# Patient Record
Sex: Female | Born: 2000 | Race: White | Hispanic: No | Marital: Single | Smoking: Never smoker
Health system: Southern US, Community
[De-identification: ages and names within clinical notes are randomized; demographics above are authoritative.]

## PROBLEM LIST (undated history)

## (undated) DIAGNOSIS — F32A Depression, unspecified: Secondary | ICD-10-CM

## (undated) DIAGNOSIS — F419 Anxiety disorder, unspecified: Secondary | ICD-10-CM

## (undated) DIAGNOSIS — K219 Gastro-esophageal reflux disease without esophagitis: Secondary | ICD-10-CM

## (undated) DIAGNOSIS — F329 Major depressive disorder, single episode, unspecified: Secondary | ICD-10-CM

## (undated) DIAGNOSIS — J309 Allergic rhinitis, unspecified: Secondary | ICD-10-CM

## (undated) DIAGNOSIS — G43909 Migraine, unspecified, not intractable, without status migrainosus: Secondary | ICD-10-CM

## (undated) DIAGNOSIS — J45909 Unspecified asthma, uncomplicated: Secondary | ICD-10-CM

## (undated) HISTORY — PX: WISDOM TOOTH EXTRACTION: SHX21

## (undated) HISTORY — DX: Depression, unspecified: F32.A

## (undated) HISTORY — DX: Anxiety disorder, unspecified: F41.9

## (undated) HISTORY — PX: BUNIONECTOMY: SHX129

## (undated) HISTORY — DX: Gastro-esophageal reflux disease without esophagitis: K21.9

## (undated) HISTORY — DX: Unspecified asthma, uncomplicated: J45.909

## (undated) HISTORY — DX: Allergic rhinitis, unspecified: J30.9

## (undated) HISTORY — DX: Migraine, unspecified, not intractable, without status migrainosus: G43.909

---
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
## 2019-01-02 ENCOUNTER — Encounter: Payer: Federal, State, Local not specified - PPO | Admitting: Podiatry

## 2019-01-07 DIAGNOSIS — F411 Generalized anxiety disorder: Secondary | ICD-10-CM | POA: Diagnosis not present

## 2019-01-15 DIAGNOSIS — J189 Pneumonia, unspecified organism: Secondary | ICD-10-CM | POA: Diagnosis not present

## 2019-01-15 DIAGNOSIS — U071 COVID-19: Secondary | ICD-10-CM | POA: Diagnosis not present

## 2019-01-15 DIAGNOSIS — F332 Major depressive disorder, recurrent severe without psychotic features: Secondary | ICD-10-CM | POA: Diagnosis not present

## 2019-01-15 DIAGNOSIS — F418 Other specified anxiety disorders: Secondary | ICD-10-CM | POA: Diagnosis not present

## 2019-01-16 ENCOUNTER — Encounter: Payer: Federal, State, Local not specified - PPO | Admitting: Podiatry

## 2019-01-27 DIAGNOSIS — F411 Generalized anxiety disorder: Secondary | ICD-10-CM | POA: Diagnosis not present

## 2019-01-30 ENCOUNTER — Encounter: Payer: Federal, State, Local not specified - PPO | Admitting: Podiatry

## 2019-02-03 DIAGNOSIS — F411 Generalized anxiety disorder: Secondary | ICD-10-CM | POA: Diagnosis not present

## 2019-02-10 DIAGNOSIS — F411 Generalized anxiety disorder: Secondary | ICD-10-CM | POA: Diagnosis not present

## 2019-02-12 DIAGNOSIS — F332 Major depressive disorder, recurrent severe without psychotic features: Secondary | ICD-10-CM | POA: Diagnosis not present

## 2019-02-12 DIAGNOSIS — F418 Other specified anxiety disorders: Secondary | ICD-10-CM | POA: Diagnosis not present

## 2019-02-17 DIAGNOSIS — F411 Generalized anxiety disorder: Secondary | ICD-10-CM | POA: Diagnosis not present

## 2019-02-28 DIAGNOSIS — F509 Eating disorder, unspecified: Secondary | ICD-10-CM | POA: Diagnosis not present

## 2019-02-28 DIAGNOSIS — F418 Other specified anxiety disorders: Secondary | ICD-10-CM | POA: Diagnosis not present

## 2019-02-28 DIAGNOSIS — F332 Major depressive disorder, recurrent severe without psychotic features: Secondary | ICD-10-CM | POA: Diagnosis not present

## 2019-04-09 DIAGNOSIS — R300 Dysuria: Secondary | ICD-10-CM | POA: Diagnosis not present

## 2019-04-09 DIAGNOSIS — N390 Urinary tract infection, site not specified: Secondary | ICD-10-CM | POA: Diagnosis not present

## 2019-04-15 DIAGNOSIS — Z01419 Encounter for gynecological examination (general) (routine) without abnormal findings: Secondary | ICD-10-CM | POA: Diagnosis not present

## 2019-04-15 DIAGNOSIS — F419 Anxiety disorder, unspecified: Secondary | ICD-10-CM | POA: Insufficient documentation

## 2019-04-15 DIAGNOSIS — Z113 Encounter for screening for infections with a predominantly sexual mode of transmission: Secondary | ICD-10-CM | POA: Diagnosis not present

## 2019-04-23 DIAGNOSIS — Z79891 Long term (current) use of opiate analgesic: Secondary | ICD-10-CM | POA: Diagnosis not present

## 2019-04-23 DIAGNOSIS — F331 Major depressive disorder, recurrent, moderate: Secondary | ICD-10-CM | POA: Diagnosis not present

## 2019-04-23 DIAGNOSIS — F411 Generalized anxiety disorder: Secondary | ICD-10-CM | POA: Diagnosis not present

## 2019-05-12 DIAGNOSIS — J069 Acute upper respiratory infection, unspecified: Secondary | ICD-10-CM | POA: Diagnosis not present

## 2019-05-12 DIAGNOSIS — R062 Wheezing: Secondary | ICD-10-CM | POA: Diagnosis not present

## 2019-05-12 DIAGNOSIS — R05 Cough: Secondary | ICD-10-CM | POA: Diagnosis not present

## 2019-05-12 DIAGNOSIS — Z1152 Encounter for screening for COVID-19: Secondary | ICD-10-CM | POA: Diagnosis not present

## 2019-06-03 DIAGNOSIS — Z915 Personal history of self-harm: Secondary | ICD-10-CM | POA: Diagnosis not present

## 2019-06-03 DIAGNOSIS — F411 Generalized anxiety disorder: Secondary | ICD-10-CM | POA: Diagnosis not present

## 2019-06-03 DIAGNOSIS — F331 Major depressive disorder, recurrent, moderate: Secondary | ICD-10-CM | POA: Diagnosis not present

## 2019-06-09 DIAGNOSIS — N760 Acute vaginitis: Secondary | ICD-10-CM | POA: Diagnosis not present

## 2019-06-09 DIAGNOSIS — A749 Chlamydial infection, unspecified: Secondary | ICD-10-CM | POA: Diagnosis not present

## 2019-06-09 DIAGNOSIS — Z113 Encounter for screening for infections with a predominantly sexual mode of transmission: Secondary | ICD-10-CM | POA: Diagnosis not present

## 2019-06-24 DIAGNOSIS — F331 Major depressive disorder, recurrent, moderate: Secondary | ICD-10-CM | POA: Diagnosis not present

## 2019-06-24 DIAGNOSIS — F411 Generalized anxiety disorder: Secondary | ICD-10-CM | POA: Diagnosis not present

## 2019-06-24 DIAGNOSIS — Z915 Personal history of self-harm: Secondary | ICD-10-CM | POA: Diagnosis not present

## 2019-06-27 DIAGNOSIS — F411 Generalized anxiety disorder: Secondary | ICD-10-CM | POA: Diagnosis not present

## 2019-06-27 DIAGNOSIS — F331 Major depressive disorder, recurrent, moderate: Secondary | ICD-10-CM | POA: Diagnosis not present

## 2019-06-27 DIAGNOSIS — Z915 Personal history of self-harm: Secondary | ICD-10-CM | POA: Diagnosis not present

## 2019-06-30 DIAGNOSIS — R895 Abnormal microbiological findings in specimens from other organs, systems and tissues: Secondary | ICD-10-CM | POA: Diagnosis not present

## 2019-07-08 DIAGNOSIS — N760 Acute vaginitis: Secondary | ICD-10-CM | POA: Diagnosis not present

## 2019-07-08 DIAGNOSIS — R309 Painful micturition, unspecified: Secondary | ICD-10-CM | POA: Diagnosis not present

## 2019-07-09 DIAGNOSIS — K219 Gastro-esophageal reflux disease without esophagitis: Secondary | ICD-10-CM | POA: Diagnosis not present

## 2019-07-09 DIAGNOSIS — G43909 Migraine, unspecified, not intractable, without status migrainosus: Secondary | ICD-10-CM | POA: Diagnosis not present

## 2019-07-11 DIAGNOSIS — F411 Generalized anxiety disorder: Secondary | ICD-10-CM | POA: Diagnosis not present

## 2019-07-11 DIAGNOSIS — Z915 Personal history of self-harm: Secondary | ICD-10-CM | POA: Diagnosis not present

## 2019-07-11 DIAGNOSIS — F331 Major depressive disorder, recurrent, moderate: Secondary | ICD-10-CM | POA: Diagnosis not present

## 2019-07-22 DIAGNOSIS — F331 Major depressive disorder, recurrent, moderate: Secondary | ICD-10-CM | POA: Diagnosis not present

## 2019-07-22 DIAGNOSIS — Z915 Personal history of self-harm: Secondary | ICD-10-CM | POA: Diagnosis not present

## 2019-07-22 DIAGNOSIS — F411 Generalized anxiety disorder: Secondary | ICD-10-CM | POA: Diagnosis not present

## 2019-08-06 DIAGNOSIS — F331 Major depressive disorder, recurrent, moderate: Secondary | ICD-10-CM | POA: Diagnosis not present

## 2019-08-06 DIAGNOSIS — Z915 Personal history of self-harm: Secondary | ICD-10-CM | POA: Diagnosis not present

## 2019-08-06 DIAGNOSIS — F411 Generalized anxiety disorder: Secondary | ICD-10-CM | POA: Diagnosis not present

## 2019-08-08 DIAGNOSIS — U071 COVID-19: Secondary | ICD-10-CM | POA: Diagnosis not present

## 2019-08-08 DIAGNOSIS — J3489 Other specified disorders of nose and nasal sinuses: Secondary | ICD-10-CM | POA: Diagnosis not present

## 2019-08-08 DIAGNOSIS — Z1152 Encounter for screening for COVID-19: Secondary | ICD-10-CM | POA: Diagnosis not present

## 2019-08-13 DIAGNOSIS — F331 Major depressive disorder, recurrent, moderate: Secondary | ICD-10-CM | POA: Diagnosis not present

## 2019-08-13 DIAGNOSIS — Z915 Personal history of self-harm: Secondary | ICD-10-CM | POA: Diagnosis not present

## 2019-08-13 DIAGNOSIS — F411 Generalized anxiety disorder: Secondary | ICD-10-CM | POA: Diagnosis not present

## 2019-09-08 DIAGNOSIS — N760 Acute vaginitis: Secondary | ICD-10-CM | POA: Diagnosis not present

## 2019-10-14 DIAGNOSIS — Z915 Personal history of self-harm: Secondary | ICD-10-CM | POA: Diagnosis not present

## 2019-10-14 DIAGNOSIS — F411 Generalized anxiety disorder: Secondary | ICD-10-CM | POA: Diagnosis not present

## 2019-10-14 DIAGNOSIS — F331 Major depressive disorder, recurrent, moderate: Secondary | ICD-10-CM | POA: Diagnosis not present

## 2019-10-24 DIAGNOSIS — F331 Major depressive disorder, recurrent, moderate: Secondary | ICD-10-CM | POA: Diagnosis not present

## 2019-10-24 DIAGNOSIS — F411 Generalized anxiety disorder: Secondary | ICD-10-CM | POA: Diagnosis not present

## 2019-11-05 DIAGNOSIS — Z23 Encounter for immunization: Secondary | ICD-10-CM | POA: Diagnosis not present

## 2019-11-10 DIAGNOSIS — Z9152 Personal history of nonsuicidal self-harm: Secondary | ICD-10-CM | POA: Diagnosis not present

## 2019-11-10 DIAGNOSIS — F411 Generalized anxiety disorder: Secondary | ICD-10-CM | POA: Diagnosis not present

## 2019-11-10 DIAGNOSIS — F332 Major depressive disorder, recurrent severe without psychotic features: Secondary | ICD-10-CM | POA: Diagnosis not present

## 2019-11-26 DIAGNOSIS — F332 Major depressive disorder, recurrent severe without psychotic features: Secondary | ICD-10-CM | POA: Diagnosis not present

## 2019-11-26 DIAGNOSIS — F411 Generalized anxiety disorder: Secondary | ICD-10-CM | POA: Diagnosis not present

## 2019-11-30 DIAGNOSIS — J029 Acute pharyngitis, unspecified: Secondary | ICD-10-CM | POA: Diagnosis not present

## 2019-11-30 DIAGNOSIS — R52 Pain, unspecified: Secondary | ICD-10-CM | POA: Diagnosis not present

## 2019-12-02 ENCOUNTER — Encounter: Payer: Self-pay | Admitting: Podiatry

## 2019-12-02 ENCOUNTER — Other Ambulatory Visit: Payer: Self-pay

## 2019-12-02 ENCOUNTER — Ambulatory Visit (INDEPENDENT_AMBULATORY_CARE_PROVIDER_SITE_OTHER): Payer: Federal, State, Local not specified - PPO | Admitting: Podiatry

## 2019-12-02 ENCOUNTER — Ambulatory Visit (INDEPENDENT_AMBULATORY_CARE_PROVIDER_SITE_OTHER): Payer: Federal, State, Local not specified - PPO

## 2019-12-02 DIAGNOSIS — M2012 Hallux valgus (acquired), left foot: Secondary | ICD-10-CM

## 2019-12-02 NOTE — Progress Notes (Signed)
She presents today after having not seen her for a year with her mother to once again discuss the bunion to the first metatarsophalangeal joint of her left foot.  She also is complaining of painful ingrown that occurs on occasion currently it is not infected but mildly tender.  She states that she attends [employer] and would like to have surgery before the end of the year.  She states that she cannot have it before the end of this year that she will have to wait until next year this time.  She denies any changes in her past medical history medications allergies surgeries or social history.  Objective: Vital signs are stable alert and oriented x3.  Pulses are strong and palpable.  Neurologic sensorium is intact deep tendon reflexes are intact.  Orthopedic evaluation demonstrates increase in the first intermetatarsal angle greater than normal value with excessive motion at the first metatarsal medial cuneiform joint.  She also has an elongated second metatarsal.  Cutaneous evaluation demonstrates sharp incurvated nail margin no erythema cellulitis drainage or odor tibial border hallux left.  Radiographs taken today demonstrate an osseously mature individual with an increase in the first intermetatarsal angle greater than normal value of greater than 15 degrees.  Hallux abductus angle is greater than normal value as well as the PASA.  Assessment: Hallux abductovalgus deformity plantarflexed elongated second metatarsal as well as ingrown nail hallux left.  Plan: Discussed etiology pathology conservative surgical therapies at this point at her to once again initial and resigned previous consent consenting for a Lapidus procedure sec metatarsal osteotomy and a chemical matrixectomy.  She understands this and is amenable to it we once again discussed anesthesia plans the fact that she will be wearing a cast nonweightbearing and I will follow-up with her in the near future for surgical intervention.

## 2019-12-08 DIAGNOSIS — F411 Generalized anxiety disorder: Secondary | ICD-10-CM | POA: Diagnosis not present

## 2019-12-08 DIAGNOSIS — F332 Major depressive disorder, recurrent severe without psychotic features: Secondary | ICD-10-CM | POA: Diagnosis not present

## 2019-12-16 ENCOUNTER — Telehealth: Payer: Self-pay

## 2019-12-16 ENCOUNTER — Ambulatory Visit: Payer: Federal, State, Local not specified - PPO | Admitting: Podiatry

## 2019-12-16 NOTE — Telephone Encounter (Signed)
DOS 12/26/2019  LAPIDUS PROC INC BUNIONECTOMY LT - 28297 METATARSAL OSTEOTOMY 2ND LT - 02585 EXC NAIL PERM 1ST LT- 11750  [employer] EFFECTIVE DATE - Mar 10, 2000  PLAN DEDUCTIBLE - $350.00 W/ $0.00 REMAINING OUT OF POCKET - $5000.00 W/ $3485.64 REMAINING COPAY $0.00 COINSURANCE - 15%  NO AUTH REQUIRED

## 2019-12-23 DIAGNOSIS — F331 Major depressive disorder, recurrent, moderate: Secondary | ICD-10-CM | POA: Diagnosis not present

## 2019-12-23 DIAGNOSIS — F411 Generalized anxiety disorder: Secondary | ICD-10-CM | POA: Diagnosis not present

## 2019-12-24 ENCOUNTER — Other Ambulatory Visit: Payer: Self-pay | Admitting: Podiatry

## 2019-12-24 DIAGNOSIS — L02212 Cutaneous abscess of back [any part, except buttock]: Secondary | ICD-10-CM | POA: Diagnosis not present

## 2019-12-24 MED ORDER — CEPHALEXIN 500 MG PO CAPS: 500.0000 mg | ORAL_CAPSULE | Freq: Three times a day (TID) | ORAL | 0 refills | Status: DC

## 2019-12-24 MED ORDER — OXYCODONE-ACETAMINOPHEN 10-325 MG PO TABS: 1.0000 | ORAL_TABLET | Freq: Three times a day (TID) | ORAL | 0 refills | Status: AC | PRN

## 2019-12-24 MED ORDER — ONDANSETRON HCL 4 MG PO TABS: 4.0000 mg | ORAL_TABLET | Freq: Three times a day (TID) | ORAL | 0 refills | Status: AC | PRN

## 2019-12-26 DIAGNOSIS — L600 Ingrowing nail: Secondary | ICD-10-CM | POA: Diagnosis not present

## 2019-12-26 DIAGNOSIS — M2012 Hallux valgus (acquired), left foot: Secondary | ICD-10-CM | POA: Diagnosis not present

## 2019-12-26 DIAGNOSIS — M2010 Hallux valgus (acquired), unspecified foot: Secondary | ICD-10-CM | POA: Diagnosis not present

## 2019-12-26 DIAGNOSIS — M21962 Unspecified acquired deformity of left lower leg: Secondary | ICD-10-CM | POA: Diagnosis not present

## 2019-12-26 DIAGNOSIS — M205X2 Other deformities of toe(s) (acquired), left foot: Secondary | ICD-10-CM | POA: Diagnosis not present

## 2019-12-26 DIAGNOSIS — M25572 Pain in left ankle and joints of left foot: Secondary | ICD-10-CM | POA: Diagnosis not present

## 2019-12-29 ENCOUNTER — Telehealth: Payer: Self-pay | Admitting: Podiatry

## 2019-12-29 NOTE — Telephone Encounter (Signed)
Trish with [facility] called saying that pt's mother has been calling them as she has been unable to get through to our office. States pt had sx on Friday and is in extreme pain and that the cast is tight. Mom requested a call back on her number of [phone].

## 2019-12-29 NOTE — Telephone Encounter (Signed)
Pt doesn't like the oxycodone and would like a high dosage ibuprofen. They would also like to know how often she is supposed to be taking her meds. Please advise.

## 2019-12-30 ENCOUNTER — Ambulatory Visit (INDEPENDENT_AMBULATORY_CARE_PROVIDER_SITE_OTHER): Payer: Federal, State, Local not specified - PPO | Admitting: Podiatry

## 2019-12-30 ENCOUNTER — Ambulatory Visit (INDEPENDENT_AMBULATORY_CARE_PROVIDER_SITE_OTHER): Payer: Federal, State, Local not specified - PPO

## 2019-12-30 ENCOUNTER — Other Ambulatory Visit: Payer: Self-pay

## 2019-12-30 ENCOUNTER — Encounter: Payer: Self-pay | Admitting: Podiatry

## 2019-12-30 DIAGNOSIS — M21962 Unspecified acquired deformity of left lower leg: Secondary | ICD-10-CM | POA: Diagnosis not present

## 2019-12-30 DIAGNOSIS — M2012 Hallux valgus (acquired), left foot: Secondary | ICD-10-CM

## 2019-12-30 DIAGNOSIS — L600 Ingrowing nail: Secondary | ICD-10-CM

## 2019-12-30 DIAGNOSIS — Z9889 Other specified postprocedural states: Secondary | ICD-10-CM

## 2019-12-30 MED ORDER — IBUPROFEN 100 MG/5ML PO SUSP: 800.0000 mg | Freq: Three times a day (TID) | ORAL | 3 refills | Status: AC

## 2019-12-30 NOTE — Telephone Encounter (Signed)
Patient coming in at 11:15 today.

## 2019-12-30 NOTE — Progress Notes (Signed)
She presents today with her mother father for her first postop visit regarding her left foot and ankle.  She states that the cast feels like is tight she is getting a burning and tingling in the plantar aspect of the midfoot to the toes.  States that her left hallux hurts where the matrixectomy was performed she gets soreness and pain around the area of the medial cuneiform joint as she points to that area.  Her mother states that they have been utilizing 3-4 ibuprofen and extra strength Tylenol or substituting the extra strength Tylenol for a half a Percocet.  She states that she is peeing but she is has not had a bowel movement denies shortness of breath fever chills nausea vomiting.  Objective: Vital signs are stable she is alert and oriented x3.  Cast is intact toes appear to be of normal color capillary foot refill is immediate.  She has plenty of movement around the proximal cast though still complaining of pain and tightness of the midfoot cast in the midfoot.  Radiographs taken today demonstrate internal fixation in good position nice reduction of the first metatarsal IM angle second metatarsal and screws are in good position.  Assessment: Well-healing surgical foot.  Plan: Bivalve the cast today and will follow up with her in a week and a half for cast removal and reapplication.

## 2019-12-30 NOTE — Telephone Encounter (Signed)
Please give her a call back if you haven't and let her know that we are sending over the motrin.  The cast will be tight for a few days.  Also make sure she is taking her baby aspirin.

## 2019-12-30 NOTE — Telephone Encounter (Signed)
Ibuprofen suspension sent into pharmacy

## 2019-12-30 NOTE — Telephone Encounter (Signed)
It is written on the bottle as to how often to take the medication I also spoke to her mother after surgery.  She is supposed to start the antibiotic the day of surgery and the zofran as needed for nausea.   Morrie Sheldon please see if you can find a liquid ibuprofen 800 tid.  She doesn't like tablets.

## 2019-12-30 NOTE — Addendum Note (Signed)
Addended by: Lottie Rater E on: 12/30/2019 05:40 PM   Modules accepted: Orders

## 2020-01-01 ENCOUNTER — Telehealth: Payer: Self-pay | Admitting: Podiatry

## 2020-01-01 ENCOUNTER — Encounter: Payer: Federal, State, Local not specified - PPO | Admitting: Podiatry

## 2020-01-01 DIAGNOSIS — M79676 Pain in unspecified toe(s): Secondary | ICD-10-CM

## 2020-01-01 NOTE — Telephone Encounter (Signed)
Please call mom, she had questions for Dr. Al Corpus for pain management. Please call her back ASAP

## 2020-01-05 ENCOUNTER — Telehealth: Payer: Self-pay | Admitting: Podiatry

## 2020-01-05 NOTE — Telephone Encounter (Signed)
Pt called and stated she got on her knee scooter and lost her balance. She stepped don't on her sx foot, she wanted to make sure she didn't do anything wrong since she was not suppose to put pressure on it. Please call patient

## 2020-01-06 ENCOUNTER — Ambulatory Visit (INDEPENDENT_AMBULATORY_CARE_PROVIDER_SITE_OTHER): Payer: Federal, State, Local not specified - PPO

## 2020-01-06 ENCOUNTER — Other Ambulatory Visit: Payer: Self-pay

## 2020-01-06 ENCOUNTER — Ambulatory Visit (INDEPENDENT_AMBULATORY_CARE_PROVIDER_SITE_OTHER): Payer: Federal, State, Local not specified - PPO | Admitting: Podiatry

## 2020-01-06 DIAGNOSIS — M2012 Hallux valgus (acquired), left foot: Secondary | ICD-10-CM

## 2020-01-06 DIAGNOSIS — Z9889 Other specified postprocedural states: Secondary | ICD-10-CM

## 2020-01-06 DIAGNOSIS — M21962 Unspecified acquired deformity of left lower leg: Secondary | ICD-10-CM

## 2020-01-07 ENCOUNTER — Encounter: Payer: Self-pay | Admitting: Podiatry

## 2020-01-07 NOTE — Progress Notes (Signed)
  Subjective:  Patient ID: Laura Francis, female    DOB: 03-07-2000,  MRN: [id number]  Chief Complaint  Patient presents with  . Routine Post Op    PT stated that she fell yesterday and she just wanted to make sure everything is okay     19 y.o. female returns for post-op check.  Patient is doing well.  She had an incident where she fell yesterday and just wanted to make sure that everything was good in terms of hardware.  Her numbness and tingling has improved.  She is in a cast that is bivalved.  Surgery was done by Dr. Al Corpus.  Review of Systems: Negative except as noted in the HPI. Denies N/V/F/Ch.  Past Medical History:  Diagnosis Date  . Anxiety   . Asthma   . Depression     Current Outpatient Medications:  .  cephALEXin (KEFLEX) 500 MG capsule, Take 1 capsule (500 mg total) by mouth 3 (three) times daily., Disp: 30 capsule, Rfl: 0 .  cloNIDine (CATAPRES) 0.1 MG tablet, Take 0.1 mg by mouth at bedtime., Disp: , Rfl:  .  CRYSELLE-28 0.3-30 MG-MCG tablet, Take 1 tablet by mouth daily., Disp: , Rfl:  .  ibuprofen (ADVIL) 100 MG/5ML suspension, Take 40 mLs (800 mg total) by mouth every 8 (eight) hours., Disp: 473 mL, Rfl: 3 .  ondansetron (ZOFRAN) 4 MG tablet, Take 1 tablet (4 mg total) by mouth every 8 (eight) hours as needed., Disp: 20 tablet, Rfl: 0 .  sulfamethoxazole-trimethoprim (BACTRIM) 200-40 MG/5ML suspension, Take by mouth., Disp: , Rfl:  .  TRINTELLIX 10 MG TABS tablet, Take 10 mg by mouth daily., Disp: , Rfl:   Social History   Tobacco Use  Smoking Status Never Smoker  Smokeless Tobacco Never Used    Allergies  Allergen Reactions  . Citrullus Vulgaris     Other reaction(s): Other (See Comments)  . Other     Other reaction(s): Other (See Comments) Bananas Other reaction(s): Other (See Comments)  . Pineapple     Other reaction(s): Other (See Comments)   Objective:  There were no vitals filed for this visit. There is no height or weight on file to  calculate BMI. Constitutional Well developed. Well nourished.  Vascular Foot warm and well perfused. Capillary refill normal to all digits.   Neurologic Normal speech. Oriented to person, place, and time. Epicritic sensation to light touch grossly present bilaterally.  Dermatologic  cast is clean dry and intact that is bivalved.  No calf pain noted.  Motor or sensory functions are intact  Orthopedic: Tenderness to palpation noted about the surgical site.   Radiographs: 3 views of skeletally mature adult left foot: Hardware is intact no signs of breaking or loosening noted.  Good correction and alignment noted. Assessment:   1. Hav (hallux abducto valgus), left   2. Deformity of metatarsal bone of left foot   3. S/P foot surgery, left    Plan:  Patient was evaluated and treated and all questions answered.  S/p foot surgery left -Progressing as expected post-operatively. -XR: See above -WB Status: Nonweightbearing to the left lower extremity in knee scooter -Sutures: Patient is in a class that is bivalved.  The cast is in good condition.  Motor or sensory function intact.  Patient will have a cast exchange this Thursday with Dr. Al Corpus -Medications: None -Foot redressed.  No follow-ups on file.

## 2020-01-08 ENCOUNTER — Encounter: Payer: Self-pay | Admitting: Podiatry

## 2020-01-08 ENCOUNTER — Other Ambulatory Visit: Payer: Self-pay

## 2020-01-08 ENCOUNTER — Ambulatory Visit (INDEPENDENT_AMBULATORY_CARE_PROVIDER_SITE_OTHER): Payer: Federal, State, Local not specified - PPO | Admitting: Podiatry

## 2020-01-08 DIAGNOSIS — M2012 Hallux valgus (acquired), left foot: Secondary | ICD-10-CM | POA: Diagnosis not present

## 2020-01-08 DIAGNOSIS — M21962 Unspecified acquired deformity of left lower leg: Secondary | ICD-10-CM

## 2020-01-08 DIAGNOSIS — Z9889 Other specified postprocedural states: Secondary | ICD-10-CM

## 2020-01-08 NOTE — Progress Notes (Signed)
She presents today for her first postop cast change.  She states that her foot is still sore and swollen.  States that her father has not been given her the pain medication because he does not want her to become addicted.  Objective: Vital signs stable alert oriented x3 cast was removed today was removed rest of dressing intact was removed demonstrates rectus foot.  Is been some mild swelling she is afraid to move the toes but with manual movement I was able to move them without problems.  Sutures are intact margins are well coapted.  Assessment: Well-healing bunion repair.  Plan: Redressed today dressed a compressive dressing recasted her today.  Follow-up with her in 2 weeks for suture removal and cast removal.  May need to place place another cast at that time.

## 2020-01-13 ENCOUNTER — Encounter: Payer: Federal, State, Local not specified - PPO | Admitting: Podiatry

## 2020-01-22 ENCOUNTER — Ambulatory Visit (INDEPENDENT_AMBULATORY_CARE_PROVIDER_SITE_OTHER): Payer: Federal, State, Local not specified - PPO | Admitting: Podiatry

## 2020-01-22 ENCOUNTER — Other Ambulatory Visit: Payer: Self-pay

## 2020-01-22 ENCOUNTER — Ambulatory Visit (INDEPENDENT_AMBULATORY_CARE_PROVIDER_SITE_OTHER): Payer: Federal, State, Local not specified - PPO

## 2020-01-22 DIAGNOSIS — Z9889 Other specified postprocedural states: Secondary | ICD-10-CM

## 2020-01-22 DIAGNOSIS — M21962 Unspecified acquired deformity of left lower leg: Secondary | ICD-10-CM | POA: Diagnosis not present

## 2020-01-22 DIAGNOSIS — M2012 Hallux valgus (acquired), left foot: Secondary | ICD-10-CM

## 2020-01-22 NOTE — Progress Notes (Signed)
She presents today approximately 4 weeks out status post Lapidus procedure left foot and matrixectomy tibial border hallux left and a second metatarsal osteotomy.  She states that she is doing pretty well.  She continues to use her crutches and scooter.  Objective: Vital signs are stable she is alert oriented x3 dry sterile dressing and cast was intact today was removed demonstrates no erythema no cellulitis drainage or odor her toes in a good rectus position.  Sutures are intact margins well coapted remove the sutures today all margins remain well coapted she doing very well with this.  She has stiff toes which she is scared to move but I encouraged her to move those.  Radiographs taken today demonstrate well healing Lapidus fusion.  Internal fixation is in good position.  Assessment: Well-healing surgical foot.  Plan: Placed her in a compression dressing for another week then allow her to take that off and start washing the foot she is not to stand on this she understands and is amenable to it we did place her in a cam walker for her protection and I will follow-up with her in 2 weeks at which time we will try to start placing weight on this foot x-rays will be necessary.

## 2020-01-26 DIAGNOSIS — F331 Major depressive disorder, recurrent, moderate: Secondary | ICD-10-CM | POA: Diagnosis not present

## 2020-01-26 DIAGNOSIS — F411 Generalized anxiety disorder: Secondary | ICD-10-CM | POA: Diagnosis not present

## 2020-01-27 ENCOUNTER — Encounter: Payer: Federal, State, Local not specified - PPO | Admitting: Podiatry

## 2020-01-29 DIAGNOSIS — F331 Major depressive disorder, recurrent, moderate: Secondary | ICD-10-CM | POA: Diagnosis not present

## 2020-01-29 DIAGNOSIS — F411 Generalized anxiety disorder: Secondary | ICD-10-CM | POA: Diagnosis not present

## 2020-02-03 DIAGNOSIS — R309 Painful micturition, unspecified: Secondary | ICD-10-CM | POA: Diagnosis not present

## 2020-02-03 DIAGNOSIS — Z113 Encounter for screening for infections with a predominantly sexual mode of transmission: Secondary | ICD-10-CM | POA: Diagnosis not present

## 2020-02-03 DIAGNOSIS — N760 Acute vaginitis: Secondary | ICD-10-CM | POA: Diagnosis not present

## 2020-02-03 DIAGNOSIS — N898 Other specified noninflammatory disorders of vagina: Secondary | ICD-10-CM | POA: Diagnosis not present

## 2020-02-05 ENCOUNTER — Ambulatory Visit (INDEPENDENT_AMBULATORY_CARE_PROVIDER_SITE_OTHER): Payer: Federal, State, Local not specified - PPO

## 2020-02-05 ENCOUNTER — Other Ambulatory Visit: Payer: Self-pay

## 2020-02-05 ENCOUNTER — Ambulatory Visit (INDEPENDENT_AMBULATORY_CARE_PROVIDER_SITE_OTHER): Payer: Federal, State, Local not specified - PPO | Admitting: Podiatry

## 2020-02-05 DIAGNOSIS — Z9889 Other specified postprocedural states: Secondary | ICD-10-CM | POA: Diagnosis not present

## 2020-02-05 DIAGNOSIS — M2012 Hallux valgus (acquired), left foot: Secondary | ICD-10-CM

## 2020-02-05 DIAGNOSIS — M21962 Unspecified acquired deformity of left lower leg: Secondary | ICD-10-CM

## 2020-02-05 NOTE — Progress Notes (Signed)
She presents today with her father continue to use her knee scooter she is about 6 weeks out at this point date of surgery was 12/26/2019 left foot Lapidus and a second metatarsal osteotomy.  She states that recently she has had no pain she is doing much better.  Wants to know if she still has to sleep in the cam boot wants to know when she will be able to walk on it.  She states she has been washing it but she has not done anything with the scar or scab.  Objective: Vital signs are stable she alert oriented x3 foot looks great there is no swelling no edema no erythema cellulitis drainage or odor she does have a black scab at the along the entire length of the incision both sides.  This is basically ready to peel off though she does not want me to do that.  She has 10 to 20 degrees range of motion dorsiflexion and plantarflexion with some minimal tenderness.  Radiographs taken today do demonstrate osseously mature individual internal fixation of screw and staple at the first TMT are intact appears to be that she is fusing very nicely.  I see no loosening of the internal fixation.  Assessment: Well-healing surgical foot.  Plan: I am going to request that she start physical therapy to get the range of motion back in the toes but she is not to stand on this foot yet until I get more x-rays.  Instructed her to tell physical therapy that range of motion exercises and scar mobilization is going to be best for her at this point time she will continue to go to school and be very careful getting up the stairs she is going to start partial weightbearing in a stance fashion with her cam boot.  I am going to allow her not to sleep with the cam boot but if she gets up and about it has to be on even with her knee scooter.  I will follow-up with her in about 3 weeks for another set of x-rays at which time we hope to be able to start ambulating in the boot.

## 2020-02-09 DIAGNOSIS — M25672 Stiffness of left ankle, not elsewhere classified: Secondary | ICD-10-CM | POA: Diagnosis not present

## 2020-02-09 DIAGNOSIS — M25675 Stiffness of left foot, not elsewhere classified: Secondary | ICD-10-CM | POA: Diagnosis not present

## 2020-02-09 DIAGNOSIS — M6281 Muscle weakness (generalized): Secondary | ICD-10-CM | POA: Diagnosis not present

## 2020-02-09 DIAGNOSIS — M25572 Pain in left ankle and joints of left foot: Secondary | ICD-10-CM | POA: Diagnosis not present

## 2020-02-10 ENCOUNTER — Encounter: Payer: Federal, State, Local not specified - PPO | Admitting: Podiatry

## 2020-02-13 DIAGNOSIS — M25572 Pain in left ankle and joints of left foot: Secondary | ICD-10-CM | POA: Diagnosis not present

## 2020-02-13 DIAGNOSIS — M6281 Muscle weakness (generalized): Secondary | ICD-10-CM | POA: Diagnosis not present

## 2020-02-13 DIAGNOSIS — M25675 Stiffness of left foot, not elsewhere classified: Secondary | ICD-10-CM | POA: Diagnosis not present

## 2020-02-13 DIAGNOSIS — M25672 Stiffness of left ankle, not elsewhere classified: Secondary | ICD-10-CM | POA: Diagnosis not present

## 2020-02-16 DIAGNOSIS — M25672 Stiffness of left ankle, not elsewhere classified: Secondary | ICD-10-CM | POA: Diagnosis not present

## 2020-02-16 DIAGNOSIS — M25675 Stiffness of left foot, not elsewhere classified: Secondary | ICD-10-CM | POA: Diagnosis not present

## 2020-02-16 DIAGNOSIS — M25572 Pain in left ankle and joints of left foot: Secondary | ICD-10-CM | POA: Diagnosis not present

## 2020-02-16 DIAGNOSIS — M6281 Muscle weakness (generalized): Secondary | ICD-10-CM | POA: Diagnosis not present

## 2020-02-17 DIAGNOSIS — M6281 Muscle weakness (generalized): Secondary | ICD-10-CM | POA: Diagnosis not present

## 2020-02-17 DIAGNOSIS — M25672 Stiffness of left ankle, not elsewhere classified: Secondary | ICD-10-CM | POA: Diagnosis not present

## 2020-02-17 DIAGNOSIS — M25572 Pain in left ankle and joints of left foot: Secondary | ICD-10-CM | POA: Diagnosis not present

## 2020-02-17 DIAGNOSIS — M25675 Stiffness of left foot, not elsewhere classified: Secondary | ICD-10-CM | POA: Diagnosis not present

## 2020-02-19 DIAGNOSIS — M25675 Stiffness of left foot, not elsewhere classified: Secondary | ICD-10-CM | POA: Diagnosis not present

## 2020-02-19 DIAGNOSIS — M6281 Muscle weakness (generalized): Secondary | ICD-10-CM | POA: Diagnosis not present

## 2020-02-19 DIAGNOSIS — M25572 Pain in left ankle and joints of left foot: Secondary | ICD-10-CM | POA: Diagnosis not present

## 2020-02-19 DIAGNOSIS — M25672 Stiffness of left ankle, not elsewhere classified: Secondary | ICD-10-CM | POA: Diagnosis not present

## 2020-02-23 DIAGNOSIS — M25572 Pain in left ankle and joints of left foot: Secondary | ICD-10-CM | POA: Diagnosis not present

## 2020-02-23 DIAGNOSIS — M25675 Stiffness of left foot, not elsewhere classified: Secondary | ICD-10-CM | POA: Diagnosis not present

## 2020-02-23 DIAGNOSIS — M25672 Stiffness of left ankle, not elsewhere classified: Secondary | ICD-10-CM | POA: Diagnosis not present

## 2020-02-23 DIAGNOSIS — M6281 Muscle weakness (generalized): Secondary | ICD-10-CM | POA: Diagnosis not present

## 2020-02-24 DIAGNOSIS — M6281 Muscle weakness (generalized): Secondary | ICD-10-CM | POA: Diagnosis not present

## 2020-02-24 DIAGNOSIS — M25572 Pain in left ankle and joints of left foot: Secondary | ICD-10-CM | POA: Diagnosis not present

## 2020-02-24 DIAGNOSIS — F331 Major depressive disorder, recurrent, moderate: Secondary | ICD-10-CM | POA: Diagnosis not present

## 2020-02-24 DIAGNOSIS — F411 Generalized anxiety disorder: Secondary | ICD-10-CM | POA: Diagnosis not present

## 2020-02-24 DIAGNOSIS — M25675 Stiffness of left foot, not elsewhere classified: Secondary | ICD-10-CM | POA: Diagnosis not present

## 2020-02-24 DIAGNOSIS — M25672 Stiffness of left ankle, not elsewhere classified: Secondary | ICD-10-CM | POA: Diagnosis not present

## 2020-02-26 ENCOUNTER — Ambulatory Visit (INDEPENDENT_AMBULATORY_CARE_PROVIDER_SITE_OTHER): Payer: Federal, State, Local not specified - PPO | Admitting: Podiatry

## 2020-02-26 ENCOUNTER — Ambulatory Visit (INDEPENDENT_AMBULATORY_CARE_PROVIDER_SITE_OTHER): Payer: Federal, State, Local not specified - PPO

## 2020-02-26 ENCOUNTER — Other Ambulatory Visit: Payer: Self-pay

## 2020-02-26 DIAGNOSIS — M21962 Unspecified acquired deformity of left lower leg: Secondary | ICD-10-CM

## 2020-02-26 DIAGNOSIS — M2012 Hallux valgus (acquired), left foot: Secondary | ICD-10-CM | POA: Diagnosis not present

## 2020-02-26 DIAGNOSIS — L600 Ingrowing nail: Secondary | ICD-10-CM

## 2020-02-26 DIAGNOSIS — Z9889 Other specified postprocedural states: Secondary | ICD-10-CM

## 2020-02-26 NOTE — Progress Notes (Signed)
She is status post a Lapidus procedure with bunionectomy and second metatarsal osteotomy left foot.  She states that she is doing quite well and has been going to physical therapy.  She states that she has not walked on it.  States that picking up marbles is very painful.  Objective: Vital signs are stable she is alert and oriented x3.  Pulses are palpable she has much improved range of motion of the first and second toes.  No tenderness on palpation of the fusion site.  Radiographs today demonstrate well healing fusion of the first TMT joint internal fixation is intact to the first and second metatarsals and appears to be tight.  Assessment: Well-healing surgical foot.  Plan: At this point unguinal recommend they tiedown second toe at nighttime to so that it can be stretched at the joint.  I did encourage her to continue physical therapy and picking up marbles and towels with her toes.  I will her to start ambulation with the boot on follow-up with her in 2 weeks at which time another set of x-rays will be done and then we will consider allowing her to get back into her regular shoes.

## 2020-02-27 ENCOUNTER — Other Ambulatory Visit: Payer: Self-pay | Admitting: Podiatry

## 2020-02-27 DIAGNOSIS — M2012 Hallux valgus (acquired), left foot: Secondary | ICD-10-CM

## 2020-03-01 DIAGNOSIS — M25672 Stiffness of left ankle, not elsewhere classified: Secondary | ICD-10-CM | POA: Diagnosis not present

## 2020-03-01 DIAGNOSIS — M25572 Pain in left ankle and joints of left foot: Secondary | ICD-10-CM | POA: Diagnosis not present

## 2020-03-01 DIAGNOSIS — M25675 Stiffness of left foot, not elsewhere classified: Secondary | ICD-10-CM | POA: Diagnosis not present

## 2020-03-01 DIAGNOSIS — M6281 Muscle weakness (generalized): Secondary | ICD-10-CM | POA: Diagnosis not present

## 2020-03-03 ENCOUNTER — Telehealth: Payer: Self-pay | Admitting: *Deleted

## 2020-03-03 NOTE — Telephone Encounter (Signed)
Called and told patient that a toe alignment splint is waiting at front desk to pick up at her convenience. She said she will pick up on Thursday.

## 2020-03-04 DIAGNOSIS — M6281 Muscle weakness (generalized): Secondary | ICD-10-CM | POA: Diagnosis not present

## 2020-03-04 DIAGNOSIS — M25572 Pain in left ankle and joints of left foot: Secondary | ICD-10-CM | POA: Diagnosis not present

## 2020-03-04 DIAGNOSIS — F331 Major depressive disorder, recurrent, moderate: Secondary | ICD-10-CM | POA: Diagnosis not present

## 2020-03-04 DIAGNOSIS — F411 Generalized anxiety disorder: Secondary | ICD-10-CM | POA: Diagnosis not present

## 2020-03-04 DIAGNOSIS — M25675 Stiffness of left foot, not elsewhere classified: Secondary | ICD-10-CM | POA: Diagnosis not present

## 2020-03-04 DIAGNOSIS — M25672 Stiffness of left ankle, not elsewhere classified: Secondary | ICD-10-CM | POA: Diagnosis not present

## 2020-03-08 DIAGNOSIS — M25572 Pain in left ankle and joints of left foot: Secondary | ICD-10-CM | POA: Diagnosis not present

## 2020-03-08 DIAGNOSIS — M25672 Stiffness of left ankle, not elsewhere classified: Secondary | ICD-10-CM | POA: Diagnosis not present

## 2020-03-08 DIAGNOSIS — M6281 Muscle weakness (generalized): Secondary | ICD-10-CM | POA: Diagnosis not present

## 2020-03-08 DIAGNOSIS — M25675 Stiffness of left foot, not elsewhere classified: Secondary | ICD-10-CM | POA: Diagnosis not present

## 2020-03-09 ENCOUNTER — Ambulatory Visit (INDEPENDENT_AMBULATORY_CARE_PROVIDER_SITE_OTHER): Payer: Federal, State, Local not specified - PPO

## 2020-03-09 ENCOUNTER — Encounter: Payer: Self-pay | Admitting: Podiatry

## 2020-03-09 ENCOUNTER — Ambulatory Visit (INDEPENDENT_AMBULATORY_CARE_PROVIDER_SITE_OTHER): Payer: Federal, State, Local not specified - PPO | Admitting: Podiatry

## 2020-03-09 ENCOUNTER — Other Ambulatory Visit: Payer: Self-pay

## 2020-03-09 DIAGNOSIS — M21962 Unspecified acquired deformity of left lower leg: Secondary | ICD-10-CM | POA: Diagnosis not present

## 2020-03-09 DIAGNOSIS — M2012 Hallux valgus (acquired), left foot: Secondary | ICD-10-CM

## 2020-03-09 DIAGNOSIS — Z9889 Other specified postprocedural states: Secondary | ICD-10-CM

## 2020-03-09 DIAGNOSIS — L600 Ingrowing nail: Secondary | ICD-10-CM

## 2020-03-11 DIAGNOSIS — M25675 Stiffness of left foot, not elsewhere classified: Secondary | ICD-10-CM | POA: Diagnosis not present

## 2020-03-11 DIAGNOSIS — M25572 Pain in left ankle and joints of left foot: Secondary | ICD-10-CM | POA: Diagnosis not present

## 2020-03-11 DIAGNOSIS — M6281 Muscle weakness (generalized): Secondary | ICD-10-CM | POA: Diagnosis not present

## 2020-03-11 DIAGNOSIS — M25672 Stiffness of left ankle, not elsewhere classified: Secondary | ICD-10-CM | POA: Diagnosis not present

## 2020-03-16 DIAGNOSIS — M25672 Stiffness of left ankle, not elsewhere classified: Secondary | ICD-10-CM | POA: Diagnosis not present

## 2020-03-16 DIAGNOSIS — M25675 Stiffness of left foot, not elsewhere classified: Secondary | ICD-10-CM | POA: Diagnosis not present

## 2020-03-16 DIAGNOSIS — M6281 Muscle weakness (generalized): Secondary | ICD-10-CM | POA: Diagnosis not present

## 2020-03-16 DIAGNOSIS — M25572 Pain in left ankle and joints of left foot: Secondary | ICD-10-CM | POA: Diagnosis not present

## 2020-03-18 DIAGNOSIS — M25572 Pain in left ankle and joints of left foot: Secondary | ICD-10-CM | POA: Diagnosis not present

## 2020-03-18 DIAGNOSIS — M6281 Muscle weakness (generalized): Secondary | ICD-10-CM | POA: Diagnosis not present

## 2020-03-18 DIAGNOSIS — M25675 Stiffness of left foot, not elsewhere classified: Secondary | ICD-10-CM | POA: Diagnosis not present

## 2020-03-18 DIAGNOSIS — M25672 Stiffness of left ankle, not elsewhere classified: Secondary | ICD-10-CM | POA: Diagnosis not present

## 2020-03-23 ENCOUNTER — Telehealth: Payer: Self-pay | Admitting: Podiatry

## 2020-03-23 NOTE — Telephone Encounter (Signed)
Patient called and stated she needed a work note to go back to work asap. She will be in the building tomorrow for PT. She would like it to include no restrictions.

## 2020-03-24 ENCOUNTER — Encounter: Payer: Self-pay | Admitting: Podiatry

## 2020-03-24 DIAGNOSIS — M6281 Muscle weakness (generalized): Secondary | ICD-10-CM | POA: Diagnosis not present

## 2020-03-24 DIAGNOSIS — M25675 Stiffness of left foot, not elsewhere classified: Secondary | ICD-10-CM | POA: Diagnosis not present

## 2020-03-24 DIAGNOSIS — M25672 Stiffness of left ankle, not elsewhere classified: Secondary | ICD-10-CM | POA: Diagnosis not present

## 2020-03-24 DIAGNOSIS — M25572 Pain in left ankle and joints of left foot: Secondary | ICD-10-CM | POA: Diagnosis not present

## 2020-03-24 NOTE — Telephone Encounter (Signed)
Dr. Al Corpus says okay for note with no restrictions

## 2020-03-25 DIAGNOSIS — M25672 Stiffness of left ankle, not elsewhere classified: Secondary | ICD-10-CM | POA: Diagnosis not present

## 2020-03-25 DIAGNOSIS — M6281 Muscle weakness (generalized): Secondary | ICD-10-CM | POA: Diagnosis not present

## 2020-03-25 DIAGNOSIS — M25572 Pain in left ankle and joints of left foot: Secondary | ICD-10-CM | POA: Diagnosis not present

## 2020-03-25 DIAGNOSIS — M25675 Stiffness of left foot, not elsewhere classified: Secondary | ICD-10-CM | POA: Diagnosis not present

## 2020-03-30 ENCOUNTER — Encounter: Payer: Self-pay | Admitting: Podiatry

## 2020-03-30 ENCOUNTER — Other Ambulatory Visit: Payer: Self-pay

## 2020-03-30 ENCOUNTER — Ambulatory Visit (INDEPENDENT_AMBULATORY_CARE_PROVIDER_SITE_OTHER): Payer: Federal, State, Local not specified - PPO

## 2020-03-30 ENCOUNTER — Ambulatory Visit (INDEPENDENT_AMBULATORY_CARE_PROVIDER_SITE_OTHER): Payer: Federal, State, Local not specified - PPO | Admitting: Podiatry

## 2020-03-30 ENCOUNTER — Other Ambulatory Visit: Payer: Self-pay | Admitting: Podiatry

## 2020-03-30 DIAGNOSIS — Z9889 Other specified postprocedural states: Secondary | ICD-10-CM

## 2020-03-30 DIAGNOSIS — M2012 Hallux valgus (acquired), left foot: Secondary | ICD-10-CM

## 2020-03-30 DIAGNOSIS — F331 Major depressive disorder, recurrent, moderate: Secondary | ICD-10-CM | POA: Diagnosis not present

## 2020-03-30 DIAGNOSIS — L600 Ingrowing nail: Secondary | ICD-10-CM

## 2020-03-30 DIAGNOSIS — M21962 Unspecified acquired deformity of left lower leg: Secondary | ICD-10-CM

## 2020-03-30 DIAGNOSIS — F411 Generalized anxiety disorder: Secondary | ICD-10-CM | POA: Diagnosis not present

## 2020-03-30 NOTE — Telephone Encounter (Signed)
I have been trying to call patient to come in and get a work a note, no response

## 2020-03-30 NOTE — Progress Notes (Signed)
She presents today with her mother date of surgery is 12/26/2019 status post Lapidus procedure sec metatarsal osteotomy.  Shoes also had a nail avulsion on tibial border.  She states that everything is going well states that she has to take stairs sideways but other than that she feels like she is doing pretty well.  States that she has occasionally not worn her shoes around the house walking barefoot as she is questioning whether or not she can do that.  Objective: Vital signs are stable alert oriented x3.  There is no erythema edema cellulitis drainage or odor surgical sites abdominal to heal uneventfully she is continues to wear her silicone scar shield today as well.  She has good range of motion  Radiographs taken today demonstrate internal fixation is tightened intact she still has good fusion of the first tarsometatarsal joint.  I will see any separations at this point.  Assessment: At this point I do think that she can cut back on physical therapy get back into her regular shoe gear and follow-up with me in 1 month for her final x-rays.

## 2020-04-15 DIAGNOSIS — Z113 Encounter for screening for infections with a predominantly sexual mode of transmission: Secondary | ICD-10-CM | POA: Diagnosis not present

## 2020-04-15 DIAGNOSIS — R309 Painful micturition, unspecified: Secondary | ICD-10-CM | POA: Diagnosis not present

## 2020-04-15 DIAGNOSIS — N760 Acute vaginitis: Secondary | ICD-10-CM | POA: Diagnosis not present

## 2020-04-15 DIAGNOSIS — R300 Dysuria: Secondary | ICD-10-CM | POA: Diagnosis not present

## 2020-04-27 DIAGNOSIS — F331 Major depressive disorder, recurrent, moderate: Secondary | ICD-10-CM | POA: Diagnosis not present

## 2020-04-27 DIAGNOSIS — F411 Generalized anxiety disorder: Secondary | ICD-10-CM | POA: Diagnosis not present

## 2020-05-04 ENCOUNTER — Ambulatory Visit (INDEPENDENT_AMBULATORY_CARE_PROVIDER_SITE_OTHER): Payer: Federal, State, Local not specified - PPO

## 2020-05-04 ENCOUNTER — Encounter: Payer: Self-pay | Admitting: Podiatry

## 2020-05-04 ENCOUNTER — Ambulatory Visit (INDEPENDENT_AMBULATORY_CARE_PROVIDER_SITE_OTHER): Payer: Federal, State, Local not specified - PPO | Admitting: Podiatry

## 2020-05-04 ENCOUNTER — Other Ambulatory Visit: Payer: Self-pay

## 2020-05-04 DIAGNOSIS — M2011 Hallux valgus (acquired), right foot: Secondary | ICD-10-CM

## 2020-05-04 DIAGNOSIS — M2012 Hallux valgus (acquired), left foot: Secondary | ICD-10-CM | POA: Diagnosis not present

## 2020-05-04 DIAGNOSIS — M7662 Achilles tendinitis, left leg: Secondary | ICD-10-CM

## 2020-05-04 DIAGNOSIS — Z9889 Other specified postprocedural states: Secondary | ICD-10-CM | POA: Diagnosis not present

## 2020-05-04 MED ORDER — METHYLPREDNISOLONE 4 MG PO TBPK: ORAL_TABLET | ORAL | 0 refills | Status: DC

## 2020-05-05 DIAGNOSIS — Z113 Encounter for screening for infections with a predominantly sexual mode of transmission: Secondary | ICD-10-CM | POA: Diagnosis not present

## 2020-05-05 DIAGNOSIS — Z01419 Encounter for gynecological examination (general) (routine) without abnormal findings: Secondary | ICD-10-CM | POA: Diagnosis not present

## 2020-05-05 NOTE — Progress Notes (Signed)
She presents today for follow-up of her Lapidus procedure bunionectomy and second metatarsal osteotomy and nail excision left foot.  States that currently the surgery feeling fine but her Achilles is starting to hurt as she has began running and exercising.  Objective: Vital signs are stable she is alert and oriented x3.  Pulses are palpable.  She has no tenderness on range of motion of the first metatarsophalangeal joint and no tenderness on palpation of the fusion site.  She does however demonstrate tenderness on palpation of the lateral insertional fibers of the Achilles just on the calcaneus.  Radiographs taken today demonstrate well healing surgical site.  No calcaneal issues.  No swelling of the Achilles.  Assessment: Well-healing surgical foot with mild insertional Achilles tendinitis lateral most likely due to her not wanting to come across her foot with a normal heel-to-toe gait.  Plan: We did discuss the use of Voltaren gel we discussed appropriate shoes and we discussed providing her with methylprednisolone and icing and heating of the tendon before and after warm-ups and cooldown.  Follow-up with her in 1 month

## 2020-05-06 ENCOUNTER — Encounter: Payer: Self-pay | Admitting: Podiatry

## 2020-05-06 ENCOUNTER — Telehealth: Payer: Self-pay | Admitting: Podiatry

## 2020-05-06 DIAGNOSIS — Z01419 Encounter for gynecological examination (general) (routine) without abnormal findings: Secondary | ICD-10-CM | POA: Diagnosis not present

## 2020-05-06 DIAGNOSIS — Z113 Encounter for screening for infections with a predominantly sexual mode of transmission: Secondary | ICD-10-CM | POA: Diagnosis not present

## 2020-05-06 NOTE — Telephone Encounter (Signed)
Patient is currently in a boot due to her ankle injury and he place of work is not allowing her to wear the boot while she is there. She is wondering could she get a work note to be out for 1 week/while she is wearing the boot. Please advise

## 2020-05-07 ENCOUNTER — Other Ambulatory Visit: Payer: Self-pay

## 2020-05-07 ENCOUNTER — Encounter: Payer: Self-pay | Admitting: Podiatry

## 2020-05-07 ENCOUNTER — Ambulatory Visit (INDEPENDENT_AMBULATORY_CARE_PROVIDER_SITE_OTHER): Payer: Federal, State, Local not specified - PPO | Admitting: Podiatry

## 2020-05-07 DIAGNOSIS — M7672 Peroneal tendinitis, left leg: Secondary | ICD-10-CM | POA: Diagnosis not present

## 2020-05-11 NOTE — Progress Notes (Signed)
She presents today for follow-up of her Lapidus procedure she states that it seems to hurt more in the ankle than in the area of surgery.  Objective: Vital signs are stable alert oriented x3.  Pulses are palpable.  Date of surgery is 12/26/2019.  She has mild tenderness on palpation of the Achilles.  Mild tenderness on palpation of the anterior ankle and tendons tendons are intact ankle is stable there is no anterior drawer posterior drawer.  There is minimal edema.  Radiographs taken today demonstrate no change in internal fixation it is in good position she appears to be healing she does have some swelling of the dorsal aspect of the foot near the first metatarsophalangeal joint area.  The ankle itself appears to be good.  The Achilles tendon appears to be good.  Assessment: Well-healing Lapidus procedure.  Mild capsulitis of the ankle most likely secondary to gait abnormality.  Plan: Discussed etiology pathology and surgical therapy at this point we discussed anti-inflammatories such as Voltaren gel I encouraged range of motion of the ankle and that she should attempt a normal heel-to-toe gait.  She understands this is amenable to it I will follow-up with her next month.

## 2020-05-12 ENCOUNTER — Encounter: Payer: Self-pay | Admitting: Podiatry

## 2020-05-12 NOTE — Progress Notes (Signed)
Subjective:  Patient ID: Laura Francis, female    DOB: [birth year],  MRN: [id number]  Chief Complaint  Patient presents with  . Foot Pain    Left foot pain     20 y.o. female presents with the above complaint.  Patient presents with complaint of left lateral ankle pain.  Patient states that she is well-known to Dr. Al Corpus who has been treating her for Achilles tendinitis.  Her pain may have shifted a little bit to the lateral aspect of the foot.  She states is painful to touch she also had a previous bunion surgery done which seems to be healing well.  She denies any other acute complaints.  She would like to discuss treatment options for this.   Review of Systems: Negative except as noted in the HPI. Denies N/V/F/Ch.  Past Medical History:  Diagnosis Date  . Anxiety   . Asthma   . Depression     Current Outpatient Medications:  .  cloNIDine (CATAPRES) 0.1 MG tablet, Take 0.1 mg by mouth at bedtime., Disp: , Rfl:  .  CRYSELLE-28 0.3-30 MG-MCG tablet, Take 1 tablet by mouth daily., Disp: , Rfl:  .  ibuprofen (ADVIL) 100 MG/5ML suspension, Take 40 mLs (800 mg total) by mouth every 8 (eight) hours., Disp: 473 mL, Rfl: 3 .  methylPREDNISolone (MEDROL DOSEPAK) 4 MG TBPK tablet, 6 day dose pack - take as directed, Disp: 21 tablet, Rfl: 0 .  ondansetron (ZOFRAN) 4 MG tablet, Take 1 tablet (4 mg total) by mouth every 8 (eight) hours as needed., Disp: 20 tablet, Rfl: 0 .  TRINTELLIX 10 MG TABS tablet, Take 10 mg by mouth daily., Disp: , Rfl:   Social History   Tobacco Use  Smoking Status Never Smoker  Smokeless Tobacco Never Used    Allergies  Allergen Reactions  . Citrullus Vulgaris     Other reaction(s): Other (See Comments)  . Other     Other reaction(s): Other (See Comments) Bananas Other reaction(s): Other (See Comments)  . Pineapple     Other reaction(s): Other (See Comments)   Objective:  There were no vitals filed for this visit. There is no height or weight on file to  calculate BMI. Constitutional Well developed. Well nourished.  Vascular Dorsalis pedis pulses palpable bilaterally. Posterior tibial pulses palpable bilaterally. Capillary refill normal to all digits.  No cyanosis or clubbing noted. Pedal hair growth normal.  Neurologic Normal speech. Oriented to person, place, and time. Epicritic sensation to light touch grossly present bilaterally.  Dermatologic Nails well groomed and normal in appearance. No open wounds. No skin lesions.  Orthopedic:  Pain on palpation along the course of the peroneal tendon.  No pain at the insertion.  Pain at the Achilles tendon as well.  Pain with dorsiflexion of the ankle as well as resisted plantarflexion eversion of the ankle.  No deep intra-articular pain noted no pain at the ATFL ligament no pain at the posterior tibial tendon   Radiographs: None Assessment:   1. Peroneal tendinitis, left    Plan:  Patient was evaluated and treated and all questions answered.  Left peroneal tendinitis -I explained to the patient the etiology of peroneal tendinitis and various treatment options were discussed.  I discussed with her that start utilizing the previous boot that she was given for surgery and eventually I would like for her to start transitioning using Tri-Lock ankle brace especially if she is doing well.  She states understanding. -Tri-Lock ankle brace was dispensed for  transition once the peroneal tendinitis has calmed down utilizing the cam boot.  No follow-ups on file.

## 2020-05-19 DIAGNOSIS — R059 Cough, unspecified: Secondary | ICD-10-CM | POA: Diagnosis not present

## 2020-05-19 DIAGNOSIS — J101 Influenza due to other identified influenza virus with other respiratory manifestations: Secondary | ICD-10-CM | POA: Diagnosis not present

## 2020-05-26 DIAGNOSIS — F331 Major depressive disorder, recurrent, moderate: Secondary | ICD-10-CM | POA: Diagnosis not present

## 2020-05-26 DIAGNOSIS — F411 Generalized anxiety disorder: Secondary | ICD-10-CM | POA: Diagnosis not present

## 2020-06-03 ENCOUNTER — Other Ambulatory Visit: Payer: Self-pay

## 2020-06-03 ENCOUNTER — Ambulatory Visit (INDEPENDENT_AMBULATORY_CARE_PROVIDER_SITE_OTHER): Payer: Federal, State, Local not specified - PPO | Admitting: Podiatry

## 2020-06-03 ENCOUNTER — Ambulatory Visit (INDEPENDENT_AMBULATORY_CARE_PROVIDER_SITE_OTHER): Payer: Federal, State, Local not specified - PPO

## 2020-06-03 ENCOUNTER — Encounter: Payer: Self-pay | Admitting: Podiatry

## 2020-06-03 DIAGNOSIS — M2011 Hallux valgus (acquired), right foot: Secondary | ICD-10-CM | POA: Diagnosis not present

## 2020-06-03 DIAGNOSIS — M7672 Peroneal tendinitis, left leg: Secondary | ICD-10-CM

## 2020-06-06 NOTE — Progress Notes (Signed)
She presents today for follow-up of her Lapidus procedure of her left foot.  States that she is doing quite well with the surgery itself though she still has stiffness about the first metatarsophalangeal joint she tries to walk barefoot occasionally the and she has started back running.  States that she has a new pair of shoes that really seems to help which today are Shon Baton and seems to be helping with her running and her peroneal tendons.  Objective: Vital signs stable alert oriented x3.  Pulses are palpable.  Radiographs taken today demonstrate 100% consolidation of the Lapidus procedure itself.  She has great alignment internal fixation is nonfractured and in good position with good compression.  She has a.m. open joint space imagine the clicking that she is feeling in the limitation range of motion is most likely due to scar tissue and her inability to perform a normal heel-to-toe gait secondary to the scar tissue.  Assessment: Well-healing surgical foot.  Plan: We will send her back to physical therapy and I will follow-up with her once they are completely done.  I did encourage her to continue to try to perform her daily activities much as possible with typical normal heel-to-toe gait.

## 2020-06-23 DIAGNOSIS — F5082 Avoidant/restrictive food intake disorder: Secondary | ICD-10-CM | POA: Diagnosis not present

## 2020-06-25 DIAGNOSIS — R531 Weakness: Secondary | ICD-10-CM | POA: Diagnosis not present

## 2020-06-25 DIAGNOSIS — R262 Difficulty in walking, not elsewhere classified: Secondary | ICD-10-CM | POA: Diagnosis not present

## 2020-06-25 DIAGNOSIS — M25572 Pain in left ankle and joints of left foot: Secondary | ICD-10-CM | POA: Diagnosis not present

## 2020-07-01 DIAGNOSIS — Z0000 Encounter for general adult medical examination without abnormal findings: Secondary | ICD-10-CM | POA: Diagnosis not present

## 2020-07-01 DIAGNOSIS — R636 Underweight: Secondary | ICD-10-CM | POA: Diagnosis not present

## 2020-07-01 DIAGNOSIS — F5089 Other specified eating disorder: Secondary | ICD-10-CM | POA: Diagnosis not present

## 2020-07-14 DIAGNOSIS — Z23 Encounter for immunization: Secondary | ICD-10-CM | POA: Diagnosis not present

## 2020-07-14 DIAGNOSIS — Z1339 Encounter for screening examination for other mental health and behavioral disorders: Secondary | ICD-10-CM | POA: Diagnosis not present

## 2020-07-14 DIAGNOSIS — Z1331 Encounter for screening for depression: Secondary | ICD-10-CM | POA: Diagnosis not present

## 2020-07-14 DIAGNOSIS — J45909 Unspecified asthma, uncomplicated: Secondary | ICD-10-CM | POA: Diagnosis not present

## 2020-07-14 DIAGNOSIS — Z0000 Encounter for general adult medical examination without abnormal findings: Secondary | ICD-10-CM | POA: Diagnosis not present

## 2020-07-16 DIAGNOSIS — Z713 Dietary counseling and surveillance: Secondary | ICD-10-CM | POA: Diagnosis not present

## 2020-07-20 DIAGNOSIS — F5089 Other specified eating disorder: Secondary | ICD-10-CM | POA: Diagnosis not present

## 2020-07-27 DIAGNOSIS — F5089 Other specified eating disorder: Secondary | ICD-10-CM | POA: Diagnosis not present

## 2020-08-02 DIAGNOSIS — Z713 Dietary counseling and surveillance: Secondary | ICD-10-CM | POA: Diagnosis not present

## 2020-08-02 DIAGNOSIS — J45909 Unspecified asthma, uncomplicated: Secondary | ICD-10-CM | POA: Diagnosis not present

## 2020-08-02 DIAGNOSIS — U071 COVID-19: Secondary | ICD-10-CM | POA: Diagnosis not present

## 2020-08-17 ENCOUNTER — Ambulatory Visit (INDEPENDENT_AMBULATORY_CARE_PROVIDER_SITE_OTHER): Payer: Federal, State, Local not specified - PPO | Admitting: Podiatry

## 2020-08-17 ENCOUNTER — Encounter: Payer: Self-pay | Admitting: Podiatry

## 2020-08-17 ENCOUNTER — Other Ambulatory Visit: Payer: Self-pay | Admitting: Podiatry

## 2020-08-17 ENCOUNTER — Ambulatory Visit (INDEPENDENT_AMBULATORY_CARE_PROVIDER_SITE_OTHER): Payer: Federal, State, Local not specified - PPO

## 2020-08-17 ENCOUNTER — Other Ambulatory Visit: Payer: Self-pay

## 2020-08-17 DIAGNOSIS — M2012 Hallux valgus (acquired), left foot: Secondary | ICD-10-CM

## 2020-08-17 DIAGNOSIS — Z9889 Other specified postprocedural states: Secondary | ICD-10-CM | POA: Diagnosis not present

## 2020-08-17 DIAGNOSIS — F5089 Other specified eating disorder: Secondary | ICD-10-CM | POA: Diagnosis not present

## 2020-08-17 DIAGNOSIS — M7672 Peroneal tendinitis, left leg: Secondary | ICD-10-CM | POA: Diagnosis not present

## 2020-08-17 DIAGNOSIS — M2011 Hallux valgus (acquired), right foot: Secondary | ICD-10-CM

## 2020-08-17 NOTE — Progress Notes (Signed)
She presents today date of surgery 12/26/2019 Lapidus procedure bunionectomy and second metatarsal osteotomy states that is doing a whole lot better on neuroma PT at home.  States that she is doing what she wants to do pretty much with exception of a lot of running.  Objective: Vital signs are stable she alert oriented x3 pulses are palpable left.  No erythema just some mild edema in the first intermetatarsal space no cellulitis drainage or odor she has great range of motion though limited slightly on dorsiflexion which is improving from previous evaluations.  Radiographic evaluation demonstrates well-healing osteotomy with internal fixation in good position no fracturing of the fixation.  She also has some lightening of her dark scar impressed that is doing as well as it is at this point.  Assessment: Well-healing Lapidus bunionectomy second metatarsal osteotomy with matrixectomy.  Plan: I encouraged her to continue her physical therapy at home increase her activity level to what ever is tolerable for her should she have any questions or concerns she will notify us immediately.  She was asking when she could possibly have the contralateral foot done and I expressed to her that she can do it anytime that she wanted to is low she had a long enough recovery..  She understands this is amenable to it we will follow-up with me on an as-needed basis.

## 2020-08-18 DIAGNOSIS — H6690 Otitis media, unspecified, unspecified ear: Secondary | ICD-10-CM | POA: Diagnosis not present

## 2020-08-23 DIAGNOSIS — Z713 Dietary counseling and surveillance: Secondary | ICD-10-CM | POA: Diagnosis not present

## 2020-09-01 DIAGNOSIS — F5089 Other specified eating disorder: Secondary | ICD-10-CM | POA: Diagnosis not present

## 2020-09-08 DIAGNOSIS — F5089 Other specified eating disorder: Secondary | ICD-10-CM | POA: Diagnosis not present

## 2020-09-16 DIAGNOSIS — F5089 Other specified eating disorder: Secondary | ICD-10-CM | POA: Diagnosis not present

## 2020-09-23 DIAGNOSIS — Z713 Dietary counseling and surveillance: Secondary | ICD-10-CM | POA: Diagnosis not present

## 2020-09-24 DIAGNOSIS — F5089 Other specified eating disorder: Secondary | ICD-10-CM | POA: Diagnosis not present

## 2020-09-29 DIAGNOSIS — F5089 Other specified eating disorder: Secondary | ICD-10-CM | POA: Diagnosis not present

## 2020-10-07 DIAGNOSIS — Z713 Dietary counseling and surveillance: Secondary | ICD-10-CM | POA: Diagnosis not present

## 2020-10-08 DIAGNOSIS — J019 Acute sinusitis, unspecified: Secondary | ICD-10-CM | POA: Diagnosis not present

## 2020-10-11 DIAGNOSIS — R051 Acute cough: Secondary | ICD-10-CM | POA: Diagnosis not present

## 2020-10-11 DIAGNOSIS — J0100 Acute maxillary sinusitis, unspecified: Secondary | ICD-10-CM | POA: Diagnosis not present

## 2020-10-27 DIAGNOSIS — R309 Painful micturition, unspecified: Secondary | ICD-10-CM | POA: Diagnosis not present

## 2020-10-27 DIAGNOSIS — N390 Urinary tract infection, site not specified: Secondary | ICD-10-CM | POA: Diagnosis not present

## 2020-10-27 DIAGNOSIS — R350 Frequency of micturition: Secondary | ICD-10-CM | POA: Diagnosis not present

## 2020-10-27 DIAGNOSIS — N760 Acute vaginitis: Secondary | ICD-10-CM | POA: Diagnosis not present

## 2020-10-27 DIAGNOSIS — F5089 Other specified eating disorder: Secondary | ICD-10-CM | POA: Diagnosis not present

## 2020-10-28 DIAGNOSIS — Z713 Dietary counseling and surveillance: Secondary | ICD-10-CM | POA: Diagnosis not present

## 2020-11-10 DIAGNOSIS — F5089 Other specified eating disorder: Secondary | ICD-10-CM | POA: Diagnosis not present

## 2020-11-11 DIAGNOSIS — Z713 Dietary counseling and surveillance: Secondary | ICD-10-CM | POA: Diagnosis not present

## 2020-11-24 DIAGNOSIS — F5089 Other specified eating disorder: Secondary | ICD-10-CM | POA: Diagnosis not present

## 2020-12-01 DIAGNOSIS — F5089 Other specified eating disorder: Secondary | ICD-10-CM | POA: Diagnosis not present

## 2020-12-02 DIAGNOSIS — Z713 Dietary counseling and surveillance: Secondary | ICD-10-CM | POA: Diagnosis not present

## 2020-12-15 DIAGNOSIS — F5089 Other specified eating disorder: Secondary | ICD-10-CM | POA: Diagnosis not present

## 2020-12-16 DIAGNOSIS — Z713 Dietary counseling and surveillance: Secondary | ICD-10-CM | POA: Diagnosis not present

## 2020-12-20 ENCOUNTER — Encounter (HOSPITAL_COMMUNITY): Payer: Self-pay | Admitting: Emergency Medicine

## 2020-12-20 ENCOUNTER — Emergency Department (HOSPITAL_COMMUNITY)
Admission: EM | Admit: 2020-12-20 | Discharge: 2020-12-20 | Disposition: A | Discharge status: Home / Self Care | Payer: Federal, State, Local not specified - PPO | Attending: Emergency Medicine | Admitting: Emergency Medicine

## 2020-12-20 DIAGNOSIS — T7421XA Adult sexual abuse, confirmed, initial encounter: Secondary | ICD-10-CM | POA: Insufficient documentation

## 2020-12-20 DIAGNOSIS — J45909 Unspecified asthma, uncomplicated: Secondary | ICD-10-CM | POA: Diagnosis not present

## 2020-12-20 LAB — RAPID HIV SCREEN (HIV 1/2 AB+AG)
HIV 1/2 Antibodies: NONREACTIVE
HIV-1 P24 Antigen - HIV24: NONREACTIVE

## 2020-12-20 LAB — I-STAT BETA HCG BLOOD, ED (MC, WL, AP ONLY): I-stat hCG, quantitative: <5 m[IU]/mL (ref <5)

## 2020-12-20 LAB — HIV ANTIBODY (ROUTINE TESTING W REFLEX): HIV Screen 4th Generation wRfx: NONREACTIVE

## 2020-12-20 MED ORDER — CEFTRIAXONE SODIUM 500 MG IJ SOLR
500.0000 mg | Freq: Once | INTRAMUSCULAR | Status: AC
  Administered 2020-12-20: 500 mg via INTRAMUSCULAR

## 2020-12-20 MED ORDER — CEFIXIME 400 MG PO CAPS
ORAL_CAPSULE | ORAL | Status: AC
  Filled 2020-12-20: qty 1

## 2020-12-20 MED ORDER — METRONIDAZOLE 500 MG PO TABS
2000.0000 mg | ORAL_TABLET | Freq: Once | ORAL | Status: AC
  Administered 2020-12-20: 2000 mg via ORAL

## 2020-12-20 MED ORDER — PROMETHAZINE HCL 25 MG PO TABS
25.0000 mg | ORAL_TABLET | Freq: Four times a day (QID) | ORAL | Status: DC | PRN
  Administered 2020-12-20: 25 mg via ORAL

## 2020-12-20 MED ORDER — AZITHROMYCIN 250 MG PO TABS
1000.0000 mg | ORAL_TABLET | Freq: Once | ORAL | Status: AC
  Administered 2020-12-20: 1000 mg via ORAL
  Filled 2020-12-20: qty 2

## 2020-12-20 MED ORDER — AZITHROMYCIN 1 G PO PACK
PACK | ORAL | Status: AC
  Filled 2020-12-20: qty 1

## 2020-12-20 MED ORDER — ULIPRISTAL ACETATE 30 MG PO TABS
30.0000 mg | ORAL_TABLET | Freq: Once | ORAL | Status: AC
  Administered 2020-12-20: 30 mg via ORAL

## 2020-12-20 MED ORDER — PROMETHAZINE HCL 25 MG PO TABS
ORAL_TABLET | ORAL | Status: AC
  Filled 2020-12-20: qty 3

## 2020-12-20 MED ORDER — ULIPRISTAL ACETATE 30 MG PO TABS
ORAL_TABLET | ORAL | Status: AC
  Filled 2020-12-20: qty 1

## 2020-12-20 MED ORDER — METRONIDAZOLE 500 MG PO TABS
ORAL_TABLET | ORAL | Status: AC
  Filled 2020-12-20: qty 4

## 2020-12-20 MED ORDER — LIDOCAINE HCL (PF) 1 % IJ SOLN
1.0000 mL | Freq: Once | INTRAMUSCULAR | Status: AC
  Administered 2020-12-20: 1 mL

## 2020-12-20 NOTE — ED Notes (Signed)
Pt not answering for vital recheck  
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
## 2020-12-20 NOTE — SANE Note (Signed)
The SANE/FNE Teacher, music) consult has been completed. The primary RN and Delorise Shiner PA have been notified. Please contact the SANE/FNE nurse on call (listed in Amion) with any further concerns.
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
## 2020-12-21 LAB — RPR: RPR Ser Ql: NONREACTIVE
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
## 2020-12-29 DIAGNOSIS — F5089 Other specified eating disorder: Secondary | ICD-10-CM | POA: Diagnosis not present

## 2021-01-04 DIAGNOSIS — F5089 Other specified eating disorder: Secondary | ICD-10-CM | POA: Diagnosis not present

## 2021-01-06 DIAGNOSIS — Z713 Dietary counseling and surveillance: Secondary | ICD-10-CM | POA: Diagnosis not present

## 2021-01-12 DIAGNOSIS — F5089 Other specified eating disorder: Secondary | ICD-10-CM | POA: Diagnosis not present

## 2021-01-13 DIAGNOSIS — Z111 Encounter for screening for respiratory tuberculosis: Secondary | ICD-10-CM | POA: Diagnosis not present

## 2021-01-20 DIAGNOSIS — Z713 Dietary counseling and surveillance: Secondary | ICD-10-CM | POA: Diagnosis not present

## 2021-02-03 DIAGNOSIS — Z713 Dietary counseling and surveillance: Secondary | ICD-10-CM | POA: Diagnosis not present

## 2021-02-07 DIAGNOSIS — J069 Acute upper respiratory infection, unspecified: Secondary | ICD-10-CM | POA: Diagnosis not present

## 2021-02-07 DIAGNOSIS — R0981 Nasal congestion: Secondary | ICD-10-CM | POA: Diagnosis not present

## 2021-02-14 DIAGNOSIS — J069 Acute upper respiratory infection, unspecified: Secondary | ICD-10-CM | POA: Diagnosis not present

## 2021-02-17 DIAGNOSIS — Z713 Dietary counseling and surveillance: Secondary | ICD-10-CM | POA: Diagnosis not present

## 2021-02-22 DIAGNOSIS — F5089 Other specified eating disorder: Secondary | ICD-10-CM | POA: Diagnosis not present

## 2021-02-23 DIAGNOSIS — F331 Major depressive disorder, recurrent, moderate: Secondary | ICD-10-CM | POA: Diagnosis not present

## 2021-03-01 DIAGNOSIS — F5089 Other specified eating disorder: Secondary | ICD-10-CM | POA: Diagnosis not present

## 2021-03-04 DIAGNOSIS — Z713 Dietary counseling and surveillance: Secondary | ICD-10-CM | POA: Diagnosis not present

## 2021-03-10 DIAGNOSIS — N760 Acute vaginitis: Secondary | ICD-10-CM | POA: Diagnosis not present

## 2021-03-10 DIAGNOSIS — N390 Urinary tract infection, site not specified: Secondary | ICD-10-CM | POA: Diagnosis not present

## 2021-03-10 DIAGNOSIS — R8279 Other abnormal findings on microbiological examination of urine: Secondary | ICD-10-CM | POA: Diagnosis not present

## 2021-03-10 DIAGNOSIS — R300 Dysuria: Secondary | ICD-10-CM | POA: Diagnosis not present

## 2021-03-15 DIAGNOSIS — F5089 Other specified eating disorder: Secondary | ICD-10-CM | POA: Diagnosis not present

## 2021-03-29 DIAGNOSIS — F5089 Other specified eating disorder: Secondary | ICD-10-CM | POA: Diagnosis not present

## 2021-04-04 DIAGNOSIS — F5089 Other specified eating disorder: Secondary | ICD-10-CM | POA: Diagnosis not present

## 2021-04-20 DIAGNOSIS — F3341 Major depressive disorder, recurrent, in partial remission: Secondary | ICD-10-CM | POA: Diagnosis not present

## 2021-04-20 DIAGNOSIS — F5089 Other specified eating disorder: Secondary | ICD-10-CM | POA: Diagnosis not present

## 2021-04-20 DIAGNOSIS — F411 Generalized anxiety disorder: Secondary | ICD-10-CM | POA: Diagnosis not present

## 2021-04-28 DIAGNOSIS — S80861A Insect bite (nonvenomous), right lower leg, initial encounter: Secondary | ICD-10-CM | POA: Diagnosis not present

## 2021-05-05 DIAGNOSIS — N390 Urinary tract infection, site not specified: Secondary | ICD-10-CM | POA: Diagnosis not present

## 2021-05-05 DIAGNOSIS — Z113 Encounter for screening for infections with a predominantly sexual mode of transmission: Secondary | ICD-10-CM | POA: Diagnosis not present

## 2021-05-05 DIAGNOSIS — N760 Acute vaginitis: Secondary | ICD-10-CM | POA: Diagnosis not present

## 2021-05-05 DIAGNOSIS — R300 Dysuria: Secondary | ICD-10-CM | POA: Diagnosis not present

## 2021-05-17 DIAGNOSIS — Z1151 Encounter for screening for human papillomavirus (HPV): Secondary | ICD-10-CM | POA: Diagnosis not present

## 2021-05-17 DIAGNOSIS — Z124 Encounter for screening for malignant neoplasm of cervix: Secondary | ICD-10-CM | POA: Diagnosis not present

## 2021-05-17 DIAGNOSIS — Z681 Body mass index (BMI) 19 or less, adult: Secondary | ICD-10-CM | POA: Diagnosis not present

## 2021-05-17 DIAGNOSIS — Z3040 Encounter for surveillance of contraceptives, unspecified: Secondary | ICD-10-CM | POA: Diagnosis not present

## 2021-05-17 DIAGNOSIS — Z113 Encounter for screening for infections with a predominantly sexual mode of transmission: Secondary | ICD-10-CM | POA: Diagnosis not present

## 2021-05-17 DIAGNOSIS — Z01419 Encounter for gynecological examination (general) (routine) without abnormal findings: Secondary | ICD-10-CM | POA: Diagnosis not present

## 2021-05-25 DIAGNOSIS — F5089 Other specified eating disorder: Secondary | ICD-10-CM | POA: Diagnosis not present

## 2021-06-01 DIAGNOSIS — F411 Generalized anxiety disorder: Secondary | ICD-10-CM | POA: Diagnosis not present

## 2021-06-01 DIAGNOSIS — F3342 Major depressive disorder, recurrent, in full remission: Secondary | ICD-10-CM | POA: Diagnosis not present

## 2021-06-02 DIAGNOSIS — Z3202 Encounter for pregnancy test, result negative: Secondary | ICD-10-CM | POA: Diagnosis not present

## 2021-06-02 DIAGNOSIS — R87810 Cervical high risk human papillomavirus (HPV) DNA test positive: Secondary | ICD-10-CM | POA: Diagnosis not present

## 2021-06-02 DIAGNOSIS — N888 Other specified noninflammatory disorders of cervix uteri: Secondary | ICD-10-CM | POA: Diagnosis not present

## 2021-06-02 DIAGNOSIS — N870 Mild cervical dysplasia: Secondary | ICD-10-CM | POA: Diagnosis not present

## 2021-06-23 DIAGNOSIS — L910 Hypertrophic scar: Secondary | ICD-10-CM | POA: Diagnosis not present

## 2021-06-23 DIAGNOSIS — L700 Acne vulgaris: Secondary | ICD-10-CM | POA: Diagnosis not present

## 2021-06-23 DIAGNOSIS — K130 Diseases of lips: Secondary | ICD-10-CM | POA: Diagnosis not present

## 2021-07-12 DIAGNOSIS — H9203 Otalgia, bilateral: Secondary | ICD-10-CM | POA: Diagnosis not present

## 2021-07-12 DIAGNOSIS — R0981 Nasal congestion: Secondary | ICD-10-CM | POA: Diagnosis not present

## 2021-07-12 DIAGNOSIS — J029 Acute pharyngitis, unspecified: Secondary | ICD-10-CM | POA: Diagnosis not present

## 2021-07-22 DIAGNOSIS — F411 Generalized anxiety disorder: Secondary | ICD-10-CM | POA: Diagnosis not present

## 2021-07-26 DIAGNOSIS — N760 Acute vaginitis: Secondary | ICD-10-CM | POA: Diagnosis not present

## 2021-07-27 DIAGNOSIS — Z0000 Encounter for general adult medical examination without abnormal findings: Secondary | ICD-10-CM | POA: Diagnosis not present

## 2021-07-27 DIAGNOSIS — Z1339 Encounter for screening examination for other mental health and behavioral disorders: Secondary | ICD-10-CM | POA: Diagnosis not present

## 2021-07-27 DIAGNOSIS — Z1331 Encounter for screening for depression: Secondary | ICD-10-CM | POA: Diagnosis not present

## 2021-07-27 DIAGNOSIS — R5382 Chronic fatigue, unspecified: Secondary | ICD-10-CM | POA: Diagnosis not present

## 2021-07-27 DIAGNOSIS — G43909 Migraine, unspecified, not intractable, without status migrainosus: Secondary | ICD-10-CM | POA: Diagnosis not present

## 2021-08-26 DIAGNOSIS — H6690 Otitis media, unspecified, unspecified ear: Secondary | ICD-10-CM | POA: Diagnosis not present

## 2021-09-09 ENCOUNTER — Telehealth: Payer: Self-pay | Admitting: *Deleted

## 2021-09-09 NOTE — Chronic Care Management (AMB) (Unsigned)
  Care Coordination  Outreach Note  09/09/2021 Name: Laura Francis MRN: [id number] DOB: [birth year]   Care Coordination Outreach Attempts  An unsuccessful telephone outreach was attempted today to offer the patient information about available care coordination services as a benefit of their health plan.   Follow Up Plan:  Additional outreach attempts will be made to offer the patient care coordination information and services.   Encounter Outcome:  No Answer   Gwenevere Ghazi  Care Coordination Care Guide  Direct Dial: [phone]

## 2021-09-12 NOTE — Chronic Care Management (AMB) (Unsigned)
  Care Coordination  Outreach Note  09/12/2021 Name: Rafael Quesada MRN: [id number] DOB: 2000-12-17   Care Coordination Outreach Attempts  A second unsuccessful outreach was attempted today to offer the patient with information about available care coordination services as a benefit of their health plan.     Follow Up Plan:  Additional outreach attempts will be made to offer the patient care coordination information and services.   Encounter Outcome:  No Answer  Gwenevere Ghazi  Care Coordination Care Guide  Direct Dial: [phone]

## 2021-09-14 NOTE — Chronic Care Management (AMB) (Signed)
  Care Coordination   Note   09/14/2021 Name: Laura Francis MRN: [id number] DOB: [birth year]  [name] is a 21 y.o. year old female who sees Cleatis Polka., MD for primary care. I reached out to Wonda Olds by phone today to offer care coordination services.  [name] was given information about Care Coordination services today including:   The Care Coordination services include support from the care team which includes your Nurse Coordinator, Clinical Social Worker, or Pharmacist.  The Care Coordination team is here to help remove barriers to the health concerns and goals most important to you. Care Coordination services are voluntary, and the patient may decline or stop services at any time by request to their care team member.   Care Coordination Consent Status: Patient agreed to services and verbal consent obtained.   Follow up plan:  Telephone appointment with care coordination team member scheduled for:  09/21/21  Encounter Outcome:  Pt. Scheduled  [facility] Coordination Care Guide  Direct Dial: [phone]

## 2021-09-21 ENCOUNTER — Ambulatory Visit: Payer: Self-pay

## 2021-09-21 NOTE — Patient Outreach (Signed)
  Care Coordination   Initial Visit Note   09/21/2021 Name: Bessie Boyte MRN: [id number] DOB: 03-18-2000  Angellica Maddison is a 21 y.o. year old female who sees Cleatis Polka., MD for primary care. I spoke with  Wonda Olds by phone today.  What matters to the patients health and wellness today?  Doing well, no concerns at this time    Goals Addressed             This Visit's Progress    COMPLETED: Care Coordination Activties       Care Coordination Interventions: SDoH screening performed - no acute resource challenges identified at this time Determined the patient does not have concerns with medication costs and/or adherence Discussed the patient is diagnosed with anxiety and depression and is actively engaged with a psychiatrist to address mental health - patient feels this provider is a good fit and meeting her needs Education provided on the role of the care coordination team - no follow up desired at this time Instructed the patient to contact her primary care provider as needed        SDOH assessments and interventions completed:  Yes  SDOH Interventions Today    Flowsheet Row Most Recent Value  SDOH Interventions   Food Insecurity Interventions Intervention Not Indicated  Housing Interventions Intervention Not Indicated  Transportation Interventions Intervention Not Indicated  Utilities Interventions Intervention Not Indicated  Financial Strain Interventions Intervention Not Indicated        Care Coordination Interventions Activated:  Yes  Care Coordination Interventions:  Yes, provided   Follow up plan: No further intervention required.   Encounter Outcome:  Pt. Visit Completed   Bevelyn Ngo, BSW, CDP Social Worker, Certified Dementia Practitioner Care Coordination [phone]

## 2021-09-21 NOTE — Patient Instructions (Signed)
Visit Information  Thank you for taking time to visit with me today. Please don't hesitate to contact me if I can be of assistance to you.   Following are the goals we discussed today:   Goals Addressed             This Visit's Progress    COMPLETED: Care Coordination Activties       Care Coordination Interventions: SDoH screening performed - no acute resource challenges identified at this time Determined the patient does not have concerns with medication costs and/or adherence Discussed the patient is diagnosed with anxiety and depression and is actively engaged with a psychiatrist to address mental health - patient feels this provider is a good fit and meeting her needs Education provided on the role of the care coordination team - no follow up desired at this time Instructed the patient to contact her primary care provider as needed        Please call the care guide team at [phone] if you need to schedule an appointment with our care coordination team  If you are experiencing a Mental Health or Behavioral Health Crisis or need someone to talk to, please call 1-800-273-TALK (toll free, 24 hour hotline)  Patient verbalizes understanding of instructions and care plan provided today and agrees to view in MyChart. Active MyChart status and patient understanding of how to access instructions and care plan via MyChart confirmed with patient.     No further follow up required: Please contact your primary care provider as needed.  Bevelyn Ngo, BSW, CDP Social Worker, Certified Dementia Practitioner Care Coordination [phone]

## 2021-10-06 ENCOUNTER — Encounter: Payer: Self-pay | Admitting: Podiatry

## 2021-10-06 ENCOUNTER — Ambulatory Visit (INDEPENDENT_AMBULATORY_CARE_PROVIDER_SITE_OTHER): Payer: Federal, State, Local not specified - PPO

## 2021-10-06 ENCOUNTER — Ambulatory Visit (INDEPENDENT_AMBULATORY_CARE_PROVIDER_SITE_OTHER): Payer: Federal, State, Local not specified - PPO | Admitting: Podiatry

## 2021-10-06 DIAGNOSIS — S99922A Unspecified injury of left foot, initial encounter: Secondary | ICD-10-CM

## 2021-10-06 NOTE — Progress Notes (Signed)
Laura Francis presents today with a chief concern of injury to her second toe yesterday as she was trying to go up the stairs.  She has had a Lapidus procedure in the past a second metatarsal osteotomy and pinning of the second toe.  She states that she hit the end of the toe on a wood riser and is excruciating.  Objective: Vital signs are stable she is alert and oriented x3.  There is mild erythema at the PIPJ of the second digit left foot and mildly tender on palpation.  The toe appears to be stable with the joint in all planes.  It is quite tender to touch.  There is no tenderness at the metatarsal phalangeal joint level.  Radiographs taken today demonstrate no overt fracture there is a possible crack in the medial condyle of the second toe proximal phalanx distally.  No displacement.  Assessment: Cannot rule out a fracture or just a contusion toe second left.  Plan: Placed her in a Darco shoe and Covan I will follow-up with her in about a month for another set of x-rays.

## 2021-10-27 ENCOUNTER — Encounter: Payer: Self-pay | Admitting: Podiatry

## 2021-10-27 ENCOUNTER — Ambulatory Visit (INDEPENDENT_AMBULATORY_CARE_PROVIDER_SITE_OTHER): Payer: Federal, State, Local not specified - PPO | Admitting: Podiatry

## 2021-10-27 ENCOUNTER — Other Ambulatory Visit: Payer: Self-pay | Admitting: Podiatry

## 2021-10-27 ENCOUNTER — Ambulatory Visit (INDEPENDENT_AMBULATORY_CARE_PROVIDER_SITE_OTHER): Payer: Federal, State, Local not specified - PPO

## 2021-10-27 DIAGNOSIS — S99922D Unspecified injury of left foot, subsequent encounter: Secondary | ICD-10-CM

## 2021-10-27 DIAGNOSIS — S99922A Unspecified injury of left foot, initial encounter: Secondary | ICD-10-CM

## 2021-10-27 NOTE — Progress Notes (Signed)
Laura Francis presents today for follow-up of her contusion to her second toe on her left foot.  She states that is doing better I does have not been to it a lot so is still little tender.  Objective: Vital signs stable tellurian x3 radiographs taken today demonstrate only a contusion to the toe Vitile see any fractures I was worried about a stress fracture but there is not 1.  Assessment: Well-healing contusion toe second left metatarsal phalangeal joint.  Plan: Follow-up with me on an as-needed basis.

## 2021-11-11 DIAGNOSIS — J069 Acute upper respiratory infection, unspecified: Secondary | ICD-10-CM | POA: Diagnosis not present

## 2021-11-11 DIAGNOSIS — J029 Acute pharyngitis, unspecified: Secondary | ICD-10-CM | POA: Diagnosis not present

## 2021-11-28 DIAGNOSIS — F411 Generalized anxiety disorder: Secondary | ICD-10-CM | POA: Diagnosis not present

## 2021-11-28 DIAGNOSIS — F3342 Major depressive disorder, recurrent, in full remission: Secondary | ICD-10-CM | POA: Diagnosis not present

## 2021-12-15 DIAGNOSIS — R300 Dysuria: Secondary | ICD-10-CM | POA: Diagnosis not present

## 2021-12-15 DIAGNOSIS — D649 Anemia, unspecified: Secondary | ICD-10-CM | POA: Diagnosis not present

## 2021-12-15 DIAGNOSIS — N760 Acute vaginitis: Secondary | ICD-10-CM | POA: Diagnosis not present

## 2021-12-15 DIAGNOSIS — L292 Pruritus vulvae: Secondary | ICD-10-CM | POA: Diagnosis not present

## 2021-12-15 DIAGNOSIS — R3989 Other symptoms and signs involving the genitourinary system: Secondary | ICD-10-CM | POA: Diagnosis not present

## 2022-01-11 DIAGNOSIS — J069 Acute upper respiratory infection, unspecified: Secondary | ICD-10-CM | POA: Diagnosis not present

## 2022-01-11 DIAGNOSIS — J029 Acute pharyngitis, unspecified: Secondary | ICD-10-CM | POA: Diagnosis not present

## 2022-03-08 DIAGNOSIS — K219 Gastro-esophageal reflux disease without esophagitis: Secondary | ICD-10-CM | POA: Diagnosis not present

## 2022-03-08 DIAGNOSIS — H9202 Otalgia, left ear: Secondary | ICD-10-CM | POA: Diagnosis not present

## 2022-03-08 DIAGNOSIS — H6692 Otitis media, unspecified, left ear: Secondary | ICD-10-CM | POA: Diagnosis not present

## 2022-03-22 DIAGNOSIS — J45909 Unspecified asthma, uncomplicated: Secondary | ICD-10-CM | POA: Diagnosis not present

## 2022-03-22 DIAGNOSIS — J3089 Other allergic rhinitis: Secondary | ICD-10-CM | POA: Diagnosis not present

## 2022-03-22 DIAGNOSIS — H6993 Unspecified Eustachian tube disorder, bilateral: Secondary | ICD-10-CM | POA: Diagnosis not present

## 2022-04-26 DIAGNOSIS — U071 COVID-19: Secondary | ICD-10-CM | POA: Diagnosis not present

## 2022-04-26 DIAGNOSIS — J029 Acute pharyngitis, unspecified: Secondary | ICD-10-CM | POA: Diagnosis not present

## 2022-04-26 DIAGNOSIS — Z1152 Encounter for screening for COVID-19: Secondary | ICD-10-CM | POA: Diagnosis not present

## 2022-04-26 DIAGNOSIS — R110 Nausea: Secondary | ICD-10-CM | POA: Diagnosis not present

## 2022-04-26 DIAGNOSIS — J45909 Unspecified asthma, uncomplicated: Secondary | ICD-10-CM | POA: Diagnosis not present

## 2022-04-26 DIAGNOSIS — J3089 Other allergic rhinitis: Secondary | ICD-10-CM | POA: Diagnosis not present

## 2022-04-26 DIAGNOSIS — R059 Cough, unspecified: Secondary | ICD-10-CM | POA: Diagnosis not present

## 2022-04-26 DIAGNOSIS — H6993 Unspecified Eustachian tube disorder, bilateral: Secondary | ICD-10-CM | POA: Diagnosis not present

## 2022-04-28 ENCOUNTER — Encounter: Payer: Self-pay | Admitting: Gastroenterology

## 2022-05-02 DIAGNOSIS — H93293 Other abnormal auditory perceptions, bilateral: Secondary | ICD-10-CM | POA: Diagnosis not present

## 2022-05-02 DIAGNOSIS — F458 Other somatoform disorders: Secondary | ICD-10-CM | POA: Diagnosis not present

## 2022-05-02 DIAGNOSIS — J3089 Other allergic rhinitis: Secondary | ICD-10-CM | POA: Diagnosis not present

## 2022-05-04 ENCOUNTER — Encounter: Payer: Self-pay | Admitting: Gastroenterology

## 2022-05-04 ENCOUNTER — Ambulatory Visit: Payer: Federal, State, Local not specified - PPO | Admitting: Gastroenterology

## 2022-05-04 VITALS — BP 122/80 | HR 77 | Ht 65.0 in | Wt 123.0 lb

## 2022-05-04 DIAGNOSIS — R1310 Dysphagia, unspecified: Secondary | ICD-10-CM

## 2022-05-04 DIAGNOSIS — K219 Gastro-esophageal reflux disease without esophagitis: Secondary | ICD-10-CM | POA: Diagnosis not present

## 2022-05-04 DIAGNOSIS — R1319 Other dysphagia: Secondary | ICD-10-CM | POA: Diagnosis not present

## 2022-05-04 NOTE — Patient Instructions (Addendum)
_______________________________________________________  If your blood pressure at your visit was 140/90 or greater, please contact your primary care physician to follow up on this.  _______________________________________________________  If you are age 23 or older, your body mass index should be between 23-30. Your Body mass index is 20.47 kg/m. If this is out of the aforementioned range listed, please consider follow up with your Primary Care Provider.  If you are age 69 or younger, your body mass index should be between 19-25. Your Body mass index is 20.47 kg/m. If this is out of the aformentioned range listed, please consider follow up with your Primary Care Provider.   You have been scheduled for an endoscopy. Please follow written instructions given to you at your visit today. If you use inhalers (even only as needed), please bring them with you on the day of your procedure.   The [facility] providers would like to encourage you to use [facility] to communicate with providers for non-urgent requests or questions.  Due to long hold times on the telephone, sending your provider a message by [facility] may be a faster and more efficient way to get a response.  Please allow 48 business hours for a response.  Please remember that this is for non-urgent requests.   It was a pleasure to see you today!  Thank you for trusting me with your gastrointestinal care!    Scott E.Tomasa Rand, MD

## 2022-05-04 NOTE — Progress Notes (Signed)
HPI : Laura Francis is a very pleasant 22 year old female with a history of asthma who is referred to [place] by Dr. Martha Clan for further evaluation of GERD symptoms.  She states that she started having symptoms of chest tightness and fullness in her chest, sometimes with a burning sensation, about 3 months ago.  She has been having these symptoms several days a week, typically noticed more after meals, particularly breakfast and dinner.  Symptoms worsened with tomato-based foods/sauces.  She has occasional nausea, and has vomited once.  No symptoms of throat-irritation/clearing, hoarseness or cough. She was treated with pantoprazole twice daily for about 6 weeks which helped quite a bit, but her insurance stopped covering it.  She then took OTC omeprazole for 14 days which did not help.   She made some dietary changes to include eliminating soda and lemonade which has helped a little. She does admit to chronic symptoms of dysphagia, manifested as the sensation of food getting held up in her chest, relieved with washing it down with a beverage. No episodes of food getting stuck  She was diagnosed with asthma as a child but does not require maintenance therapy.  She has a sister with eczema.  No other family members with swallowing problems that she is aware of.    Past Medical History:  Diagnosis Date   Allergic rhinitis    Anxiety    Asthma    Chronic GERD    Depression    Migraines      Past Surgical History:  Procedure Laterality Date   BUNIONECTOMY     WISDOM TOOTH EXTRACTION     Family History  Problem Relation Age of Onset   Pancreatic cancer Paternal Grandfather    Liver disease Neg Hx    Esophageal cancer Neg Hx    Colon cancer Neg Hx    Social History   Tobacco Use   Smoking status: Never   Smokeless tobacco: Never  Vaping Use   Vaping Use: Former   Quit date: 08/11/2018  Substance Use Topics   Alcohol use: Yes   Drug use: Not Currently    Types: Marijuana    Current Outpatient Medications  Medication Sig Dispense Refill   fluticasone (FLONASE) 50 MCG/ACT nasal spray 1 spray in each nostril Nasally Once a day for 30 days     montelukast (SINGULAIR) 10 MG tablet Take 10 mg by mouth daily.     Multiple Vitamin (MULTIVITAMIN ADULT) TABS take one tablet by mouth everyday Oral     pantoprazole (PROTONIX) 40 MG tablet 1 tablet Orally twice a day for 30 days     CRYSELLE-28 0.3-30 MG-MCG tablet Take 1 tablet by mouth daily.     DULoxetine (CYMBALTA) 60 MG capsule 30 mg.     ibuprofen (ADVIL) 100 MG/5ML suspension Take 40 mLs (800 mg total) by mouth every 8 (eight) hours. 473 mL 3   loratadine (CLARITIN) 10 MG tablet 1 tablet Orally Once a day (Patient not taking: Reported on 05/04/2022)     ondansetron (ZOFRAN) 4 MG tablet Take 1 tablet (4 mg total) by mouth every 8 (eight) hours as needed. (Patient not taking: Reported on 05/04/2022) 20 tablet 0   No current facility-administered medications for this visit.   Allergies  Allergen Reactions   Citrullus Vulgaris     Other reaction(s): Other (See Comments)   Other     Other reaction(s): Other (See Comments) Bananas Other reaction(s): Other (See Comments)   Pineapple  Other reaction(s): Other (See Comments)   Sulfa Antibiotics Hives     Review of Systems: All systems reviewed and negative except where noted in HPI.    No results found.  Physical Exam: BP 122/80   Pulse 77   Ht  (1.651 m)   Wt 123 lb (55.8 kg)   BMI 20.47 kg/m  Constitutional: Pleasant,well-developed, Caucasian female in no acute distress. HEENT: Normocephalic and atraumatic. Conjunctivae are normal. No scleral icterus. Neck supple.  Cardiovascular: Normal rate, regular rhythm.  Pulmonary/chest: Effort normal and breath sounds normal. No wheezing, rales or rhonchi. Abdominal: Soft, nondistended, nontender. Bowel sounds active throughout. There are no masses palpable. No hepatomegaly. Extremities: no  edema Lymphadenopathy: No cervical adenopathy noted. Neurological: Alert and oriented to person place and time. Skin: Skin is warm and dry. No rashes noted. Psychiatric: Normal mood and affect. Behavior is normal.  CBC    Component Value Date/Time   WBC 8.5 08/12/2018 0658   RBC 4.23 08/12/2018 0658   HGB 12.5 08/12/2018 0658   HCT 39.5 08/12/2018 0658   HCT 36.7 08/12/2018 0658   PLT 304 08/12/2018 0658   MCV 93.4 08/12/2018 0658   MCH 29.6 08/12/2018 0658   MCHC 31.6 08/12/2018 0658   RDW 12.8 08/12/2018 0658    CMP     Component Value Date/Time   NA 139 08/12/2018 0658   K 3.8 08/12/2018 0658   CL 108 08/12/2018 0658   CO2 24 08/12/2018 0658   GLUCOSE 79 08/12/2018 0658   BUN 11 08/12/2018 0658   CREATININE 0.88 08/12/2018 0658   CALCIUM 9.5 08/12/2018 0658   PROT 7.6 08/12/2018 0658   ALBUMIN 4.0 08/12/2018 0658   AST 16 08/12/2018 0658   ALT 12 08/12/2018 0658   ALKPHOS 45 08/12/2018 0658   BILITOT 0.5 08/12/2018 0658   GFRNONAA >60 08/12/2018 0658   GFRAA >60 08/12/2018 0658     ASSESSMENT AND PLAN:  22 year old female with 3 months of typical GERD symptoms, as well as long standing mild solid dysphagia.  GERD symptoms responsive to high dose pantoprazole, but not OTC omeprazole.  She has a history of asthma and family eczema.  Given her long standing dysphagia and atopic history, high degree of suspicion for EoE in addition to GERD.  Recommend EGD with esophageal biopsies and dilation of any focal strictures. We discussed the pathophysiology of GERD and the principles of GERD management to include lifestyle modifications  such as dietary discretion (avoidance of alcohol, tobacco, caffeinated and carbonated beverages, spicy/greasy foods, citrus, peppermint/chocolate), weight loss if applicable, head of bed elevation andconsuming last meal of day within 3 hours of bedtime; pharmacologic options to include PPIs, H2RAs and OTC antacids; and finally surgical or  endoscopic fundoplication.   GERD/dysphagia - EGD with esophageal biopsies  The details, risks (including bleeding, perforation, infection, missed lesions, medication reactions and possible hospitalization or surgery if complications occur), benefits, and alternatives to EGD with possible biopsy and possible dilation were discussed with the patient and she consents to proceed.   Ibn Stief E. Tomasa Rand, MD [facility]   Cleatis Polka., MD

## 2022-05-08 DIAGNOSIS — N644 Mastodynia: Secondary | ICD-10-CM | POA: Diagnosis not present

## 2022-05-22 DIAGNOSIS — Z1151 Encounter for screening for human papillomavirus (HPV): Secondary | ICD-10-CM | POA: Diagnosis not present

## 2022-05-22 DIAGNOSIS — Z113 Encounter for screening for infections with a predominantly sexual mode of transmission: Secondary | ICD-10-CM | POA: Diagnosis not present

## 2022-05-22 DIAGNOSIS — Z124 Encounter for screening for malignant neoplasm of cervix: Secondary | ICD-10-CM | POA: Diagnosis not present

## 2022-05-25 DIAGNOSIS — Z113 Encounter for screening for infections with a predominantly sexual mode of transmission: Secondary | ICD-10-CM | POA: Diagnosis not present

## 2022-05-25 DIAGNOSIS — Z01419 Encounter for gynecological examination (general) (routine) without abnormal findings: Secondary | ICD-10-CM | POA: Diagnosis not present

## 2022-05-29 DIAGNOSIS — F5101 Primary insomnia: Secondary | ICD-10-CM | POA: Diagnosis not present

## 2022-05-29 DIAGNOSIS — F3342 Major depressive disorder, recurrent, in full remission: Secondary | ICD-10-CM | POA: Diagnosis not present

## 2022-06-06 ENCOUNTER — Encounter: Payer: Self-pay | Admitting: Gastroenterology

## 2022-06-06 ENCOUNTER — Telehealth: Payer: Self-pay | Admitting: Gastroenterology

## 2022-06-06 ENCOUNTER — Ambulatory Visit (AMBULATORY_SURGERY_CENTER): Payer: Federal, State, Local not specified - PPO | Admitting: Gastroenterology

## 2022-06-06 VITALS — BP 120/76 | HR 78 | Temp 98.6°F | Resp 15 | Ht 65.0 in | Wt 123.0 lb

## 2022-06-06 DIAGNOSIS — K449 Diaphragmatic hernia without obstruction or gangrene: Secondary | ICD-10-CM | POA: Diagnosis not present

## 2022-06-06 DIAGNOSIS — R1319 Other dysphagia: Secondary | ICD-10-CM

## 2022-06-06 DIAGNOSIS — K229 Disease of esophagus, unspecified: Secondary | ICD-10-CM | POA: Diagnosis not present

## 2022-06-06 DIAGNOSIS — K219 Gastro-esophageal reflux disease without esophagitis: Secondary | ICD-10-CM | POA: Diagnosis not present

## 2022-06-06 DIAGNOSIS — R1310 Dysphagia, unspecified: Secondary | ICD-10-CM | POA: Diagnosis not present

## 2022-06-06 DIAGNOSIS — K222 Esophageal obstruction: Secondary | ICD-10-CM

## 2022-06-06 MED ORDER — SODIUM CHLORIDE 0.9 % IV SOLN: 500.0000 mL | INTRAVENOUS | Status: DC

## 2022-06-06 MED ORDER — OMEPRAZOLE 20 MG PO CPDR: 20.0000 mg | DELAYED_RELEASE_CAPSULE | Freq: Every day | ORAL | 3 refills | Status: DC

## 2022-06-06 NOTE — Telephone Encounter (Signed)
Advised pt that she can either purchase OTC prilosec tablets or open the capsule and sprinkle on applesauce.  Pt verbalized understanding.
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
## 2022-06-06 NOTE — Patient Instructions (Addendum)
Resume previous diet Continue present medications, start taking Prilosec 20 mg daily, 30 minutes before eating Await pathology results Handouts/information given for GERD, GERD diet, hiatal hernia, esophageal dilation and esophageal stricture  YOU HAD AN ENDOSCOPIC PROCEDURE TODAY AT THE [facility]:   Refer to the procedure report that was given to you for any specific questions about what was found during the examination.  If the procedure report does not answer your questions, please call your gastroenterologist to clarify.  If you requested that your care partner not be given the details of your procedure findings, then the procedure report has been included in a sealed envelope for you to review at your convenience later.  YOU SHOULD EXPECT: Some feelings of bloating in the abdomen. Passage of more gas than usual.  Walking can help get rid of the air that was put into your GI tract during the procedure and reduce the bloating. If you had a lower endoscopy (such as a colonoscopy or flexible sigmoidoscopy) you may notice spotting of blood in your stool or on the toilet paper. If you underwent a bowel prep for your procedure, you may not have a normal bowel movement for a few days.  Please Note:  You might notice some irritation and congestion in your nose or some drainage.  This is from the oxygen used during your procedure.  There is no need for concern and it should clear up in a day or so.  SYMPTOMS TO REPORT IMMEDIATELY:  Following upper endoscopy (EGD)  Vomiting of blood or coffee ground material  New chest pain or pain under the shoulder blades  Painful or persistently difficult swallowing  New shortness of breath  Fever of 100F or higher  Black, tarry-looking stools  For urgent or emergent issues, a gastroenterologist can be reached at any hour by calling [phone]. Do not use MyChart messaging for urgent concerns.   DIET:  We do recommend a small meal at first,  but then you may proceed to your regular diet. Follow GERD diet recommendations.  Drink plenty of fluids but you should avoid alcoholic beverages for 24 hours.  ACTIVITY:  You should plan to take it easy for the rest of today and you should NOT DRIVE or use heavy machinery until tomorrow (because of the sedation medicines used during the test).    FOLLOW UP: Our staff will call the number listed on your records the next business day following your procedure.  We will call around 7:15- 8:00 am to check on you and address any questions or concerns that you may have regarding the information given to you following your procedure. If we do not reach you, we will leave a message.     If any biopsies were taken you will be contacted by phone or by letter within the next 1-3 weeks.  Please call us at [phone] if you have not heard about the biopsies in 3 weeks.   SIGNATURES/CONFIDENTIALITY: You and/or your care partner have signed paperwork which will be entered into your electronic medical record.  These signatures attest to the fact that that the information above on your After Visit Summary has been reviewed and is understood.  Full responsibility of the confidentiality of this discharge information lies with you and/or your care-partner.

## 2022-06-06 NOTE — Progress Notes (Signed)
Sedate, gd SR, tolerated procedure well, VSS, report to RN 

## 2022-06-06 NOTE — Telephone Encounter (Signed)
Patient called stating she has a endoscopy today 05/21 and was prescribed Prilosec capsule. She is requesting a tablet be sen over instead because she has trouble swallowing a capsule. Please advise, thank you.

## 2022-06-06 NOTE — Progress Notes (Signed)
Called to room to assist during endoscopic procedure.  Patient ID and intended procedure confirmed with present staff. Received instructions for my participation in the procedure from the performing physician.  

## 2022-06-06 NOTE — Op Note (Signed)
[facility] Patient Name: Laura Francis Procedure Date: 06/06/2022 10:03 AM MRN: [id number] Endoscopist: Lorin Picket E. Tomasa Rand , MD, [id number] Age: 22 Referring MD:  Date of Birth: [birth year] Gender: Female Account #: [id number] Procedure:                Upper GI endoscopy Indications:              Dysphagia, Suspected esophageal reflux Medicines:                Monitored Anesthesia Care Procedure:                Pre-Anesthesia Assessment:                           - Prior to the procedure, a History and Physical                            was performed, and patient medications and                            allergies were reviewed. The patient's tolerance of                            previous anesthesia was also reviewed. The risks                            and benefits of the procedure and the sedation                            options and risks were discussed with the patient.                            All questions were answered, and informed consent                            was obtained. Prior Anticoagulants: The patient has                            taken no anticoagulant or antiplatelet agents. ASA                            Grade Assessment: II - A patient with mild systemic                            disease. After reviewing the risks and benefits,                            the patient was deemed in satisfactory condition to                            undergo the procedure.                           After obtaining informed consent, the endoscope was  passed under direct vision. Throughout the                            procedure, the patient's blood pressure, pulse, and                            oxygen saturations were monitored continuously. The                            GIF [id number] #[id number] was introduced through the                            mouth, and advanced to the second part of duodenum.                            The  upper GI endoscopy was accomplished without                            difficulty. The patient tolerated the procedure                            well. Scope In: Scope Out: Findings:                 The examined portions of the nasopharynx,                            oropharynx and larynx were normal.                           One benign-appearing, intrinsic mild stenosis was                            found in the distal esophagus. This stenosis                            measured 1.3 cm (inner diameter) x less than one cm                            (in length). The stenosis was traversed. A TTS                            dilator was passed through the scope. Dilation with                            a 16-17-18 mm balloon dilator was performed to 18                            mm. The dilation site was examined and showed mild                            mucosal disruption. Estimated blood loss was  minimal.                           Localized mild mucosal changes characterized by                            longitudinal furrows were found in the lower third                            of the esophagus (involving 3-4 cm of mucosa only).                            Biopsies were obtained from the proximal and distal                            esophagus with cold forceps for histology of                            suspected eosinophilic esophagitis. Estimated blood                            loss was minimal.                           The entire examined stomach was normal.                           A 3 cm hiatal hernia was present.                           The examined duodenum was normal. Complications:            No immediate complications. Estimated Blood Loss:     Estimated blood loss was minimal. Impression:               - The examined portions of the nasopharynx,                            oropharynx and larynx were normal.                           -  Benign-appearing esophageal stenosis. Dilated.                           - Longitudinally marked mucosa in the esophagus.                           - Normal stomach.                           - 3 cm hiatal hernia.                           - Normal examined duodenum.                           - Biopsies were taken with a cold  forceps for                            evaluation of eosinophilic esophagitis.                           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 Recommendation:           - Patient has a contact number available for                            emergencies. The signs and symptoms of potential                            delayed complications were discussed with the                            patient. Return to normal activities tomorrow.                            Written discharge instructions were provided to the                            patient.                           - Resume previous diet.                           - Continue present medications.                           - Await pathology results.                           - Use Prilosec (omeprazole) 20 mg PO daily. Reniah Cottingham E. Tomasa Rand, MD 06/06/2022 10:45:32 AM This report has been signed electronically.

## 2022-06-07 ENCOUNTER — Telehealth: Payer: Self-pay | Admitting: *Deleted

## 2022-06-07 NOTE — Telephone Encounter (Signed)
No answer for post procedure call back. Left VM. 

## 2022-06-08 ENCOUNTER — Telehealth: Payer: Self-pay | Admitting: Gastroenterology

## 2022-06-08 NOTE — Telephone Encounter (Signed)
Pt had EGD done on Tuesday. She reports she is having some soreness when she touches or stretches in her sternum area. She wanted to see if this may be related to the procedure. Please advise.

## 2022-06-08 NOTE — Telephone Encounter (Signed)
PT had an EGD on 5/21 and has been having a lot of soreness in her sternum. Please advise.

## 2022-06-09 NOTE — Telephone Encounter (Signed)
Spoke with pt and she is aware and reports it does not hurt anymore.

## 2022-06-12 NOTE — Progress Notes (Signed)
Laura Francis,  The biopsies of your esophagus showed some inflammatory changes and elevated eosinophils, but did not meet criteria for eosinophilic esophagitis.  As discussed, I think your swallowing difficulties are likely related to the narrowing in your esophagus from acid reflux.  Please take the omeprazole every day and focus on the dietary recommendations provided for managing GERD.

## 2022-06-16 DIAGNOSIS — J301 Allergic rhinitis due to pollen: Secondary | ICD-10-CM | POA: Diagnosis not present

## 2022-06-21 DIAGNOSIS — R051 Acute cough: Secondary | ICD-10-CM | POA: Diagnosis not present

## 2022-06-21 DIAGNOSIS — J3089 Other allergic rhinitis: Secondary | ICD-10-CM | POA: Diagnosis not present

## 2022-06-21 DIAGNOSIS — Z1152 Encounter for screening for COVID-19: Secondary | ICD-10-CM | POA: Diagnosis not present

## 2022-06-22 DIAGNOSIS — R87611 Atypical squamous cells cannot exclude high grade squamous intraepithelial lesion on cytologic smear of cervix (ASC-H): Secondary | ICD-10-CM | POA: Diagnosis not present

## 2022-06-22 DIAGNOSIS — N870 Mild cervical dysplasia: Secondary | ICD-10-CM | POA: Diagnosis not present

## 2022-06-22 DIAGNOSIS — N72 Inflammatory disease of cervix uteri: Secondary | ICD-10-CM | POA: Diagnosis not present

## 2022-07-21 DIAGNOSIS — R8279 Other abnormal findings on microbiological examination of urine: Secondary | ICD-10-CM | POA: Diagnosis not present

## 2022-07-21 DIAGNOSIS — R8271 Bacteriuria: Secondary | ICD-10-CM | POA: Diagnosis not present

## 2022-07-21 DIAGNOSIS — N898 Other specified noninflammatory disorders of vagina: Secondary | ICD-10-CM | POA: Diagnosis not present

## 2022-07-21 DIAGNOSIS — R350 Frequency of micturition: Secondary | ICD-10-CM | POA: Diagnosis not present

## 2022-07-21 DIAGNOSIS — R319 Hematuria, unspecified: Secondary | ICD-10-CM | POA: Diagnosis not present

## 2022-07-26 DIAGNOSIS — R7989 Other specified abnormal findings of blood chemistry: Secondary | ICD-10-CM | POA: Diagnosis not present

## 2022-08-03 DIAGNOSIS — N390 Urinary tract infection, site not specified: Secondary | ICD-10-CM | POA: Diagnosis not present

## 2022-08-03 DIAGNOSIS — N760 Acute vaginitis: Secondary | ICD-10-CM | POA: Diagnosis not present

## 2022-08-04 DIAGNOSIS — Z1331 Encounter for screening for depression: Secondary | ICD-10-CM | POA: Diagnosis not present

## 2022-08-04 DIAGNOSIS — J45909 Unspecified asthma, uncomplicated: Secondary | ICD-10-CM | POA: Diagnosis not present

## 2022-08-04 DIAGNOSIS — Z1339 Encounter for screening examination for other mental health and behavioral disorders: Secondary | ICD-10-CM | POA: Diagnosis not present

## 2022-08-04 DIAGNOSIS — R300 Dysuria: Secondary | ICD-10-CM | POA: Diagnosis not present

## 2022-08-04 DIAGNOSIS — Z0000 Encounter for general adult medical examination without abnormal findings: Secondary | ICD-10-CM | POA: Diagnosis not present

## 2022-08-24 DIAGNOSIS — H9201 Otalgia, right ear: Secondary | ICD-10-CM | POA: Diagnosis not present

## 2022-08-24 DIAGNOSIS — H6993 Unspecified Eustachian tube disorder, bilateral: Secondary | ICD-10-CM | POA: Diagnosis not present

## 2022-08-26 DIAGNOSIS — R069 Unspecified abnormalities of breathing: Secondary | ICD-10-CM | POA: Diagnosis not present

## 2022-08-26 DIAGNOSIS — R4702 Dysphasia: Secondary | ICD-10-CM | POA: Diagnosis not present

## 2022-09-27 DIAGNOSIS — L700 Acne vulgaris: Secondary | ICD-10-CM | POA: Diagnosis not present

## 2022-09-27 DIAGNOSIS — D1801 Hemangioma of skin and subcutaneous tissue: Secondary | ICD-10-CM | POA: Diagnosis not present

## 2022-11-22 DIAGNOSIS — F3342 Major depressive disorder, recurrent, in full remission: Secondary | ICD-10-CM | POA: Diagnosis not present

## 2022-11-22 DIAGNOSIS — F5101 Primary insomnia: Secondary | ICD-10-CM | POA: Diagnosis not present

## 2022-12-18 DIAGNOSIS — J3089 Other allergic rhinitis: Secondary | ICD-10-CM | POA: Diagnosis not present

## 2022-12-27 DIAGNOSIS — N760 Acute vaginitis: Secondary | ICD-10-CM | POA: Diagnosis not present

## 2022-12-27 DIAGNOSIS — R300 Dysuria: Secondary | ICD-10-CM | POA: Diagnosis not present

## 2022-12-29 DIAGNOSIS — L810 Postinflammatory hyperpigmentation: Secondary | ICD-10-CM | POA: Diagnosis not present

## 2022-12-29 DIAGNOSIS — L700 Acne vulgaris: Secondary | ICD-10-CM | POA: Diagnosis not present

## 2023-01-02 DIAGNOSIS — N760 Acute vaginitis: Secondary | ICD-10-CM | POA: Diagnosis not present

## 2023-01-02 DIAGNOSIS — R300 Dysuria: Secondary | ICD-10-CM | POA: Diagnosis not present

## 2023-01-02 DIAGNOSIS — Z113 Encounter for screening for infections with a predominantly sexual mode of transmission: Secondary | ICD-10-CM | POA: Diagnosis not present

## 2023-01-12 DIAGNOSIS — R0981 Nasal congestion: Secondary | ICD-10-CM | POA: Diagnosis not present

## 2023-01-12 DIAGNOSIS — J069 Acute upper respiratory infection, unspecified: Secondary | ICD-10-CM | POA: Diagnosis not present

## 2023-01-12 DIAGNOSIS — J3089 Other allergic rhinitis: Secondary | ICD-10-CM | POA: Diagnosis not present

## 2023-02-15 DIAGNOSIS — R300 Dysuria: Secondary | ICD-10-CM | POA: Diagnosis not present

## 2023-02-15 DIAGNOSIS — N898 Other specified noninflammatory disorders of vagina: Secondary | ICD-10-CM | POA: Diagnosis not present

## 2023-02-15 DIAGNOSIS — N760 Acute vaginitis: Secondary | ICD-10-CM | POA: Diagnosis not present

## 2023-03-26 DIAGNOSIS — R0981 Nasal congestion: Secondary | ICD-10-CM | POA: Diagnosis not present

## 2023-03-26 DIAGNOSIS — J069 Acute upper respiratory infection, unspecified: Secondary | ICD-10-CM | POA: Diagnosis not present

## 2023-03-27 DIAGNOSIS — R300 Dysuria: Secondary | ICD-10-CM | POA: Diagnosis not present

## 2023-03-27 DIAGNOSIS — R102 Pelvic and perineal pain: Secondary | ICD-10-CM | POA: Diagnosis not present

## 2023-04-09 DIAGNOSIS — R0981 Nasal congestion: Secondary | ICD-10-CM | POA: Diagnosis not present

## 2023-04-09 DIAGNOSIS — B349 Viral infection, unspecified: Secondary | ICD-10-CM | POA: Diagnosis not present

## 2023-04-17 DIAGNOSIS — L300 Nummular dermatitis: Secondary | ICD-10-CM | POA: Diagnosis not present

## 2023-04-17 DIAGNOSIS — L700 Acne vulgaris: Secondary | ICD-10-CM | POA: Diagnosis not present

## 2023-05-17 DIAGNOSIS — L292 Pruritus vulvae: Secondary | ICD-10-CM | POA: Diagnosis not present

## 2023-05-17 DIAGNOSIS — R300 Dysuria: Secondary | ICD-10-CM | POA: Diagnosis not present

## 2023-05-17 DIAGNOSIS — N760 Acute vaginitis: Secondary | ICD-10-CM | POA: Diagnosis not present

## 2023-06-15 DIAGNOSIS — F411 Generalized anxiety disorder: Secondary | ICD-10-CM | POA: Diagnosis not present

## 2023-06-15 DIAGNOSIS — F5101 Primary insomnia: Secondary | ICD-10-CM | POA: Diagnosis not present

## 2023-06-15 DIAGNOSIS — F3342 Major depressive disorder, recurrent, in full remission: Secondary | ICD-10-CM | POA: Diagnosis not present

## 2023-06-18 DIAGNOSIS — R87612 Low grade squamous intraepithelial lesion on cytologic smear of cervix (LGSIL): Secondary | ICD-10-CM | POA: Diagnosis not present

## 2023-06-18 DIAGNOSIS — Z01419 Encounter for gynecological examination (general) (routine) without abnormal findings: Secondary | ICD-10-CM | POA: Diagnosis not present

## 2023-06-18 DIAGNOSIS — Z1151 Encounter for screening for human papillomavirus (HPV): Secondary | ICD-10-CM | POA: Diagnosis not present

## 2023-06-18 DIAGNOSIS — Z124 Encounter for screening for malignant neoplasm of cervix: Secondary | ICD-10-CM | POA: Diagnosis not present

## 2023-07-10 DIAGNOSIS — J0100 Acute maxillary sinusitis, unspecified: Secondary | ICD-10-CM | POA: Diagnosis not present

## 2023-07-10 DIAGNOSIS — R0981 Nasal congestion: Secondary | ICD-10-CM | POA: Diagnosis not present

## 2023-07-16 DIAGNOSIS — Z3202 Encounter for pregnancy test, result negative: Secondary | ICD-10-CM | POA: Diagnosis not present

## 2023-07-16 DIAGNOSIS — R87612 Low grade squamous intraepithelial lesion on cytologic smear of cervix (LGSIL): Secondary | ICD-10-CM | POA: Diagnosis not present

## 2023-08-13 DIAGNOSIS — Z79899 Other long term (current) drug therapy: Secondary | ICD-10-CM | POA: Diagnosis not present

## 2023-08-17 DIAGNOSIS — H6993 Unspecified Eustachian tube disorder, bilateral: Secondary | ICD-10-CM | POA: Diagnosis not present

## 2023-08-17 DIAGNOSIS — R0981 Nasal congestion: Secondary | ICD-10-CM | POA: Diagnosis not present

## 2023-08-28 DIAGNOSIS — Z111 Encounter for screening for respiratory tuberculosis: Secondary | ICD-10-CM | POA: Diagnosis not present

## 2023-08-30 DIAGNOSIS — Z1331 Encounter for screening for depression: Secondary | ICD-10-CM | POA: Diagnosis not present

## 2023-08-30 DIAGNOSIS — J45909 Unspecified asthma, uncomplicated: Secondary | ICD-10-CM | POA: Diagnosis not present

## 2023-08-30 DIAGNOSIS — Z1339 Encounter for screening examination for other mental health and behavioral disorders: Secondary | ICD-10-CM | POA: Diagnosis not present

## 2023-08-30 DIAGNOSIS — Z0000 Encounter for general adult medical examination without abnormal findings: Secondary | ICD-10-CM | POA: Diagnosis not present

## 2023-09-06 ENCOUNTER — Other Ambulatory Visit: Payer: Self-pay | Admitting: Gastroenterology

## 2023-09-06 DIAGNOSIS — K219 Gastro-esophageal reflux disease without esophagitis: Secondary | ICD-10-CM

## 2023-09-06 DIAGNOSIS — K449 Diaphragmatic hernia without obstruction or gangrene: Secondary | ICD-10-CM

## 2023-09-06 DIAGNOSIS — R1319 Other dysphagia: Secondary | ICD-10-CM

## 2023-09-19 DIAGNOSIS — M26623 Arthralgia of bilateral temporomandibular joint: Secondary | ICD-10-CM | POA: Diagnosis not present

## 2023-09-19 DIAGNOSIS — H9203 Otalgia, bilateral: Secondary | ICD-10-CM | POA: Diagnosis not present

## 2023-10-18 DIAGNOSIS — F411 Generalized anxiety disorder: Secondary | ICD-10-CM | POA: Diagnosis not present

## 2023-10-18 DIAGNOSIS — F322 Major depressive disorder, single episode, severe without psychotic features: Secondary | ICD-10-CM | POA: Diagnosis not present

## 2023-11-01 DIAGNOSIS — F322 Major depressive disorder, single episode, severe without psychotic features: Secondary | ICD-10-CM | POA: Diagnosis not present

## 2023-11-01 DIAGNOSIS — F411 Generalized anxiety disorder: Secondary | ICD-10-CM | POA: Diagnosis not present

## 2023-11-14 DIAGNOSIS — L700 Acne vulgaris: Secondary | ICD-10-CM | POA: Diagnosis not present

## 2023-11-15 DIAGNOSIS — F411 Generalized anxiety disorder: Secondary | ICD-10-CM | POA: Diagnosis not present

## 2023-11-15 DIAGNOSIS — G44209 Tension-type headache, unspecified, not intractable: Secondary | ICD-10-CM | POA: Diagnosis not present

## 2023-11-15 DIAGNOSIS — F322 Major depressive disorder, single episode, severe without psychotic features: Secondary | ICD-10-CM | POA: Diagnosis not present

## 2023-11-15 DIAGNOSIS — J0140 Acute pansinusitis, unspecified: Secondary | ICD-10-CM | POA: Diagnosis not present

## 2023-11-15 DIAGNOSIS — J45909 Unspecified asthma, uncomplicated: Secondary | ICD-10-CM | POA: Diagnosis not present

## 2023-11-22 DIAGNOSIS — F322 Major depressive disorder, single episode, severe without psychotic features: Secondary | ICD-10-CM | POA: Diagnosis not present

## 2023-11-22 DIAGNOSIS — F411 Generalized anxiety disorder: Secondary | ICD-10-CM | POA: Diagnosis not present

## 2023-12-10 DIAGNOSIS — F4322 Adjustment disorder with anxiety: Secondary | ICD-10-CM | POA: Diagnosis not present

## 2023-12-10 DIAGNOSIS — F5101 Primary insomnia: Secondary | ICD-10-CM | POA: Diagnosis not present

## 2023-12-10 DIAGNOSIS — F411 Generalized anxiety disorder: Secondary | ICD-10-CM | POA: Diagnosis not present

## 2023-12-20 DIAGNOSIS — F322 Major depressive disorder, single episode, severe without psychotic features: Secondary | ICD-10-CM | POA: Diagnosis not present

## 2023-12-20 DIAGNOSIS — F411 Generalized anxiety disorder: Secondary | ICD-10-CM | POA: Diagnosis not present

## 2024-01-15 DIAGNOSIS — J029 Acute pharyngitis, unspecified: Secondary | ICD-10-CM | POA: Diagnosis not present

## 2024-01-15 DIAGNOSIS — J09X2 Influenza due to identified novel influenza A virus with other respiratory manifestations: Secondary | ICD-10-CM | POA: Diagnosis not present

## 2024-01-15 DIAGNOSIS — R300 Dysuria: Secondary | ICD-10-CM | POA: Diagnosis not present
